# Patient Record
Sex: Female | Born: 1935 | Race: White | Hispanic: No | Marital: Married | Smoking: Former smoker
Health system: Southern US, Community
[De-identification: ages and names within clinical notes are randomized; demographics above are authoritative.]

## PROBLEM LIST (undated history)

## (undated) DIAGNOSIS — Z87828 Personal history of other (healed) physical injury and trauma: Secondary | ICD-10-CM

## (undated) DIAGNOSIS — I2510 Atherosclerotic heart disease of native coronary artery without angina pectoris: Secondary | ICD-10-CM

## (undated) DIAGNOSIS — I700 Atherosclerosis of aorta: Secondary | ICD-10-CM

## (undated) DIAGNOSIS — I517 Cardiomegaly: Secondary | ICD-10-CM

## (undated) DIAGNOSIS — G459 Transient cerebral ischemic attack, unspecified: Secondary | ICD-10-CM

## (undated) DIAGNOSIS — E785 Hyperlipidemia, unspecified: Secondary | ICD-10-CM

## (undated) DIAGNOSIS — E0500 Thyrotoxicosis with diffuse goiter without thyrotoxic crisis or storm: Secondary | ICD-10-CM

## (undated) DIAGNOSIS — I10 Essential (primary) hypertension: Secondary | ICD-10-CM

## (undated) DIAGNOSIS — Z7901 Long term (current) use of anticoagulants: Secondary | ICD-10-CM

## (undated) DIAGNOSIS — R001 Bradycardia, unspecified: Secondary | ICD-10-CM

## (undated) DIAGNOSIS — K449 Diaphragmatic hernia without obstruction or gangrene: Secondary | ICD-10-CM

## (undated) DIAGNOSIS — I350 Nonrheumatic aortic (valve) stenosis: Secondary | ICD-10-CM

## (undated) DIAGNOSIS — K5790 Diverticulosis of intestine, part unspecified, without perforation or abscess without bleeding: Secondary | ICD-10-CM

## (undated) HISTORY — PX: BRAIN SURGERY: SHX531

## (undated) HISTORY — DX: Long term (current) use of anticoagulants: Z79.01

## (undated) HISTORY — DX: Personal history of other (healed) physical injury and trauma: Z87.828

## (undated) HISTORY — DX: Atherosclerotic heart disease of native coronary artery without angina pectoris: I25.10

## (undated) HISTORY — DX: Thyrotoxicosis with diffuse goiter without thyrotoxic crisis or storm: E05.00

## (undated) HISTORY — DX: Diverticulosis of intestine, part unspecified, without perforation or abscess without bleeding: K57.90

## (undated) HISTORY — DX: Diaphragmatic hernia without obstruction or gangrene: K44.9

## (undated) HISTORY — DX: Bradycardia, unspecified: R00.1

## (undated) HISTORY — DX: Cardiomegaly: I51.7

## (undated) HISTORY — DX: Nonrheumatic aortic (valve) stenosis: I35.0

## (undated) HISTORY — DX: Hyperlipidemia, unspecified: E78.5

## (undated) HISTORY — DX: Atherosclerosis of aorta: I70.0

---

## 1998-07-18 ENCOUNTER — Other Ambulatory Visit: Admission: RE | Admit: 1998-07-18 | Discharge: 1998-07-18 | Payer: Self-pay | Admitting: *Deleted

## 2004-08-06 ENCOUNTER — Other Ambulatory Visit: Payer: Self-pay

## 2004-08-06 ENCOUNTER — Inpatient Hospital Stay: Payer: Self-pay | Admitting: Cardiology

## 2004-11-06 ENCOUNTER — Encounter: Payer: Self-pay | Admitting: Internal Medicine

## 2004-11-18 ENCOUNTER — Encounter: Payer: Self-pay | Admitting: Internal Medicine

## 2004-12-19 ENCOUNTER — Encounter: Payer: Self-pay | Admitting: Internal Medicine

## 2005-01-18 ENCOUNTER — Encounter: Payer: Self-pay | Admitting: Internal Medicine

## 2006-08-20 ENCOUNTER — Ambulatory Visit: Payer: Self-pay | Admitting: Unknown Physician Specialty

## 2007-10-25 ENCOUNTER — Emergency Department: Payer: Self-pay | Admitting: Emergency Medicine

## 2007-10-25 ENCOUNTER — Other Ambulatory Visit: Payer: Self-pay

## 2007-12-01 ENCOUNTER — Inpatient Hospital Stay: Payer: Self-pay | Admitting: Surgery

## 2009-08-02 ENCOUNTER — Ambulatory Visit: Payer: Self-pay

## 2009-12-28 ENCOUNTER — Ambulatory Visit: Payer: Self-pay | Admitting: Unknown Physician Specialty

## 2011-05-29 ENCOUNTER — Ambulatory Visit: Payer: Self-pay | Admitting: Internal Medicine

## 2012-04-20 ENCOUNTER — Ambulatory Visit: Payer: Self-pay | Admitting: Internal Medicine

## 2012-08-18 ENCOUNTER — Ambulatory Visit: Payer: Self-pay | Admitting: Internal Medicine

## 2012-10-08 ENCOUNTER — Ambulatory Visit: Payer: Self-pay | Admitting: Internal Medicine

## 2013-09-09 ENCOUNTER — Ambulatory Visit: Payer: Self-pay | Admitting: Internal Medicine

## 2013-09-27 ENCOUNTER — Ambulatory Visit: Payer: Self-pay | Admitting: Internal Medicine

## 2014-08-18 ENCOUNTER — Other Ambulatory Visit: Payer: Self-pay | Admitting: Internal Medicine

## 2014-08-18 DIAGNOSIS — R600 Localized edema: Secondary | ICD-10-CM

## 2014-08-23 ENCOUNTER — Ambulatory Visit
Admission: RE | Admit: 2014-08-23 | Discharge: 2014-08-23 | Disposition: A | Discharge status: Home / Self Care | Payer: Medicare Other | Source: Ambulatory Visit | Attending: Internal Medicine | Admitting: Internal Medicine

## 2014-08-23 DIAGNOSIS — R600 Localized edema: Secondary | ICD-10-CM

## 2015-01-24 ENCOUNTER — Encounter: Payer: Self-pay | Admitting: Emergency Medicine

## 2015-01-24 ENCOUNTER — Observation Stay
Admission: EM | Admit: 2015-01-24 | Discharge: 2015-01-26 | Disposition: A | Discharge status: Home / Self Care | Payer: Medicare Other | Attending: Specialist | Admitting: Specialist

## 2015-01-24 ENCOUNTER — Emergency Department: Payer: Medicare Other

## 2015-01-24 DIAGNOSIS — E213 Hyperparathyroidism, unspecified: Secondary | ICD-10-CM | POA: Diagnosis not present

## 2015-01-24 DIAGNOSIS — I639 Cerebral infarction, unspecified: Secondary | ICD-10-CM

## 2015-01-24 DIAGNOSIS — Z882 Allergy status to sulfonamides status: Secondary | ICD-10-CM | POA: Diagnosis not present

## 2015-01-24 DIAGNOSIS — H538 Other visual disturbances: Secondary | ICD-10-CM | POA: Insufficient documentation

## 2015-01-24 DIAGNOSIS — R4781 Slurred speech: Secondary | ICD-10-CM

## 2015-01-24 DIAGNOSIS — Z87891 Personal history of nicotine dependence: Secondary | ICD-10-CM | POA: Insufficient documentation

## 2015-01-24 DIAGNOSIS — R531 Weakness: Secondary | ICD-10-CM | POA: Diagnosis not present

## 2015-01-24 DIAGNOSIS — Z7982 Long term (current) use of aspirin: Secondary | ICD-10-CM | POA: Insufficient documentation

## 2015-01-24 DIAGNOSIS — R0902 Hypoxemia: Secondary | ICD-10-CM | POA: Insufficient documentation

## 2015-01-24 DIAGNOSIS — I517 Cardiomegaly: Secondary | ICD-10-CM | POA: Diagnosis not present

## 2015-01-24 DIAGNOSIS — R2689 Other abnormalities of gait and mobility: Secondary | ICD-10-CM | POA: Diagnosis present

## 2015-01-24 DIAGNOSIS — Z79899 Other long term (current) drug therapy: Secondary | ICD-10-CM | POA: Diagnosis not present

## 2015-01-24 DIAGNOSIS — I739 Peripheral vascular disease, unspecified: Secondary | ICD-10-CM | POA: Diagnosis not present

## 2015-01-24 DIAGNOSIS — I2510 Atherosclerotic heart disease of native coronary artery without angina pectoris: Secondary | ICD-10-CM | POA: Diagnosis not present

## 2015-01-24 DIAGNOSIS — R001 Bradycardia, unspecified: Secondary | ICD-10-CM | POA: Insufficient documentation

## 2015-01-24 DIAGNOSIS — I6523 Occlusion and stenosis of bilateral carotid arteries: Secondary | ICD-10-CM | POA: Insufficient documentation

## 2015-01-24 DIAGNOSIS — E0590 Thyrotoxicosis, unspecified without thyrotoxic crisis or storm: Secondary | ICD-10-CM | POA: Insufficient documentation

## 2015-01-24 DIAGNOSIS — Z8673 Personal history of transient ischemic attack (TIA), and cerebral infarction without residual deficits: Secondary | ICD-10-CM | POA: Diagnosis not present

## 2015-01-24 DIAGNOSIS — I10 Essential (primary) hypertension: Secondary | ICD-10-CM | POA: Diagnosis not present

## 2015-01-24 DIAGNOSIS — Z7902 Long term (current) use of antithrombotics/antiplatelets: Secondary | ICD-10-CM | POA: Diagnosis not present

## 2015-01-24 DIAGNOSIS — G459 Transient cerebral ischemic attack, unspecified: Principal | ICD-10-CM | POA: Insufficient documentation

## 2015-01-24 DIAGNOSIS — E785 Hyperlipidemia, unspecified: Secondary | ICD-10-CM | POA: Insufficient documentation

## 2015-01-24 HISTORY — DX: Transient cerebral ischemic attack, unspecified: G45.9

## 2015-01-24 HISTORY — DX: Essential (primary) hypertension: I10

## 2015-01-24 LAB — BASIC METABOLIC PANEL
ANION GAP: 5 (ref 5–15)
BUN: 25 mg/dL — AB (ref 6–20)
CALCIUM: 9.2 mg/dL (ref 8.9–10.3)
CO2: 26 mmol/L (ref 22–32)
Chloride: 111 mmol/L (ref 101–111)
Creatinine, Ser: 0.61 mg/dL (ref 0.44–1.00)
GFR calc Af Amer: >60 mL/min (ref >60)
GLUCOSE: 97 mg/dL (ref 65–99)
POTASSIUM: 4.1 mmol/L (ref 3.5–5.1)
SODIUM: 142 mmol/L (ref 135–145)

## 2015-01-24 LAB — URINALYSIS COMPLETE WITH MICROSCOPIC (ARMC ONLY)
BILIRUBIN URINE: NEGATIVE
GLUCOSE, UA: NEGATIVE mg/dL
Hgb urine dipstick: NEGATIVE
LEUKOCYTES UA: NEGATIVE
NITRITE: NEGATIVE
Protein, ur: NEGATIVE mg/dL
SPECIFIC GRAVITY, URINE: 1.027 (ref 1.005–1.030)
pH: 5 (ref 5.0–8.0)

## 2015-01-24 LAB — CBC
HCT: 35.3 % (ref 35.0–47.0)
Hemoglobin: 11.7 g/dL — ABNORMAL LOW (ref 12.0–16.0)
MCH: 31.2 pg (ref 26.0–34.0)
MCHC: 33 g/dL (ref 32.0–36.0)
MCV: 94.7 fL (ref 80.0–100.0)
Platelets: 239 10*3/uL (ref 150–440)
RBC: 3.73 MIL/uL — ABNORMAL LOW (ref 3.80–5.20)
RDW: 13.4 % (ref 11.5–14.5)
WBC: 9.9 10*3/uL (ref 3.6–11.0)

## 2015-01-24 MED ORDER — NITROGLYCERIN 0.4 MG SL SUBL: 0.4000 mg | SUBLINGUAL_TABLET | SUBLINGUAL | Status: DC | PRN

## 2015-01-24 MED ORDER — ONDANSETRON HCL 4 MG/2ML IJ SOLN: 4.0000 mg | Freq: Four times a day (QID) | INTRAMUSCULAR | Status: DC | PRN

## 2015-01-24 MED ORDER — ONDANSETRON HCL 4 MG PO TABS: 4.0000 mg | ORAL_TABLET | Freq: Four times a day (QID) | ORAL | Status: DC | PRN

## 2015-01-24 MED ORDER — ASPIRIN EC 81 MG PO TBEC
81.0000 mg | DELAYED_RELEASE_TABLET | Freq: Every day | ORAL | Status: DC
  Administered 2015-01-24 – 2015-01-25 (×2): 81 mg via ORAL
  Filled 2015-01-24 (×2): qty 1

## 2015-01-24 MED ORDER — ADULT MULTIVITAMIN W/MINERALS CH
1.0000 | ORAL_TABLET | Freq: Every day | ORAL | Status: DC
  Administered 2015-01-25: 16:00:00 1 via ORAL
  Filled 2015-01-24: qty 1

## 2015-01-24 MED ORDER — IRBESARTAN 150 MG PO TABS
300.0000 mg | ORAL_TABLET | Freq: Every day | ORAL | Status: DC
  Administered 2015-01-25 – 2015-01-26 (×2): 300 mg via ORAL
  Filled 2015-01-24: qty 4
  Filled 2015-01-24: qty 2
  Filled 2015-01-24: qty 4

## 2015-01-24 MED ORDER — SODIUM CHLORIDE 0.9 % IJ SOLN
3.0000 mL | Freq: Two times a day (BID) | INTRAMUSCULAR | Status: DC
  Administered 2015-01-24: 23:00:00 3 mL via INTRAVENOUS

## 2015-01-24 MED ORDER — CALCIUM CARBONATE-VITAMIN D 500-200 MG-UNIT PO TABS
1.0000 | ORAL_TABLET | Freq: Every day | ORAL | Status: DC
  Administered 2015-01-25 – 2015-01-26 (×2): 1 via ORAL
  Filled 2015-01-24 (×2): qty 1

## 2015-01-24 MED ORDER — ACETAMINOPHEN 650 MG RE SUPP: 650.0000 mg | Freq: Four times a day (QID) | RECTAL | Status: DC | PRN

## 2015-01-24 MED ORDER — CITALOPRAM HYDROBROMIDE 20 MG PO TABS
20.0000 mg | ORAL_TABLET | Freq: Every day | ORAL | Status: DC
  Administered 2015-01-25 – 2015-01-26 (×2): 20 mg via ORAL
  Filled 2015-01-24 (×2): qty 1

## 2015-01-24 MED ORDER — METHIMAZOLE 5 MG PO TABS
2.5000 mg | ORAL_TABLET | Freq: Every day | ORAL | Status: DC
  Administered 2015-01-25 – 2015-01-26 (×2): 2.5 mg via ORAL
  Filled 2015-01-24 (×2): qty 1

## 2015-01-24 MED ORDER — ACETAMINOPHEN 325 MG PO TABS: 650.0000 mg | ORAL_TABLET | Freq: Four times a day (QID) | ORAL | Status: DC | PRN

## 2015-01-24 MED ORDER — SODIUM CHLORIDE 0.9 % IV SOLN
INTRAVENOUS | Status: DC
Start: 2015-01-24 — End: 2015-01-25
  Administered 2015-01-24 – 2015-01-25 (×2): via INTRAVENOUS

## 2015-01-24 MED ORDER — MORPHINE SULFATE (PF) 2 MG/ML IV SOLN
2.0000 mg | INTRAVENOUS | Status: DC | PRN
Start: 2015-01-24 — End: 2015-01-26

## 2015-01-24 MED ORDER — STROKE: EARLY STAGES OF RECOVERY BOOK
Freq: Once | Status: AC
  Administered 2015-01-24: 22:00:00

## 2015-01-24 MED ORDER — SIMVASTATIN 40 MG PO TABS
40.0000 mg | ORAL_TABLET | Freq: Every day | ORAL | Status: DC
Start: 2015-01-24 — End: 2015-01-25
  Administered 2015-01-24: 23:00:00 40 mg via ORAL
  Filled 2015-01-24: qty 1

## 2015-01-24 MED ORDER — OXYCODONE HCL 5 MG PO TABS: 5.0000 mg | ORAL_TABLET | ORAL | Status: DC | PRN

## 2015-01-24 NOTE — ED Notes (Signed)
Pt ambulatory to stat desk with c/o  Trouble talking and walking started last night. Pt with hx of TIA>

## 2015-01-24 NOTE — ED Provider Notes (Signed)
[facility] Emergency Department Provider Note  ____________________________________________  Time seen: Approximately 7:11 PM  I have reviewed the triage vital signs and the nursing notes.   HISTORY  Chief Complaint Weakness  Haley Rollins is obtained from the patient and her daughter.  HPI Haley Rollins is a 81 y.o. female with a history of HTN, TIA, remote intracranial hemorrhage requiring neurosurgical decompression presenting with changes in speech. Patient and her daughter report that since yesterday morning the patient has been having some slurred speech and occasional word finding difficulty. In addition she has been composing "sentences that don't make sense." The only other symptom the patient has noticed is some imbalance when she walks; she has not had a fall. She denies any numbness, tingling, weakness, double vision, chest pain or palpitations, lightheadedness or syncope, fever or chills, or changes in medications. No trauma. She has had intermittent blurred vision where the TV is not as sharp as before.   Past Medical History  Diagnosis Date  . TIA (transient ischemic attack)   . Hypertension     Patient Active Problem List   Diagnosis Date Noted  . TIA (transient ischemic attack) 01/24/2015    Past Surgical History  Procedure Laterality Date  . Brain surgery      No current outpatient prescriptions on file.  Allergies Sulfa antibiotics  Family History  Problem Relation Age of Onset  . Diabetes Neg Hx     Social History Social History  Substance Use Topics  . Smoking status: Former Smoker    Types: Cigarettes  . Smokeless tobacco: None  . Alcohol Use: 0.6 - 1.2 oz/week    1-2 Glasses of wine per week    Review of Systems Constitutional: No fever/chills. No lightheadedness or syncope. Eyes: Occasional blurred vision. ENT: No sore throat. Cardiovascular: Denies chest pain, palpitations. Respiratory: Denies shortness of  breath.  No cough. Gastrointestinal: No abdominal pain.  No nausea, no vomiting.  No diarrhea.  No constipation. Genitourinary: Negative for dysuria. Musculoskeletal: Negative for back pain. Skin: Negative for rash. Neurological: Negative for headache, changes in mental status. Positive for slurred speech, expressive aphasia. Positive for intermittent blurred vision. Positive for gait imbalance.  10-point ROS otherwise negative.  ____________________________________________   PHYSICAL EXAM:  VITAL SIGNS: ED Triage Vitals  Enc Vitals Group     BP 01/24/15 1836 191/70 mmHg     Pulse Rate 01/24/15 1836 55     Resp 01/24/15 1836 18     Temp 01/24/15 1836 97.5 F (36.4 C)     Temp Source 01/24/15 1836 Oral     SpO2 01/24/15 1836 99 %     Weight 01/24/15 1836 190 lb (86.183 kg)     Height 01/24/15 1836 5\' 5"  (1.651 m)     Head Cir --      Peak Flow --      Pain Score --      Pain Loc --      Pain Edu? --      Excl. in [place]? --     Constitutional: Alert and oriented. Well appearing and in no acute distress. Answer question appropriately. Eyes: Conjunctivae are normal.  EOMI. PERRLA. Head: Atraumatic. Nose: No congestion/rhinnorhea. Mouth/Throat: Mucous membranes are moist.  Neck: No stridor.  Supple.  No JVD. Cardiovascular: Normal rate, regular rhythm. No murmurs, rubs or gallops.  Respiratory: Normal respiratory effort.  No retractions. Lungs CTAB.  No wheezes, rales or ronchi. Gastrointestinal: Soft and nontender. No distention. No peritoneal  signs. Musculoskeletal: No LE edema.  Neurologic: Alert and oriented 3. Speech is clear. Naming and repetition are intact. At times, the patient appears to have word finding difficulty. Face and smile symmetric. Tongue is midline. Normal visual fields. No pronator drift. 5 out of 5 grip, biceps, triceps, hip flexors, plantar flexion and dorsiflexion. Normal sensation to light touch in the bilateral upper and lower extremities, and face.  Normal heel-to-shin. Skin:  Skin is warm, dry and intact. No rash noted. Psychiatric: Mood and affect are normal. Speech and behavior are normal.  Normal judgement.  ____________________________________________   LABS (all labs ordered are listed, but only abnormal results are displayed)  Labs Reviewed  BASIC METABOLIC PANEL - Abnormal; Notable for the following:    BUN 25 (*)    All other components within normal limits  CBC - Abnormal; Notable for the following:    RBC 3.73 (*)    Hemoglobin 11.7 (*)    All other components within normal limits  URINALYSIS COMPLETEWITH MICROSCOPIC ([facility] ONLY) - Abnormal; Notable for the following:    Color, Urine YELLOW (*)    APPearance HAZY (*)    Ketones, ur 1+ (*)    Bacteria, UA RARE (*)    Squamous Epithelial / LPF 6-30 (*)    All other components within normal limits  HEMOGLOBIN A1C  LIPID PANEL  THYROID PANEL WITH TSH  CBG MONITORING, ED   ____________________________________________  EKG  ED ECG REPORT I, Eula Listen, the attending physician, personally viewed and interpreted this ECG.   Date: 01/24/2015  EKG Time: 1838  Rate: 53  Rhythm: sinus bradycardia  Axis: Leftward  Intervals:none  ST&T Change: Nonspecific T-wave inversions in V1. No ST elevation.  ____________________________________________  RADIOLOGY  Ct Head Wo Contrast  01/24/2015  CLINICAL DATA:  Trouble speaking and walking.  History of TIA. EXAM: CT HEAD WITHOUT CONTRAST TECHNIQUE: Contiguous axial images were obtained from the base of the skull through the vertex without intravenous contrast. COMPARISON:  04/20/2012 FINDINGS: Skull and Sinuses:Unremarkable appearance of left anterior craniotomy site for unknown indication. No acute fracture. No destructive process. No visualized sinusitis or mastoiditis. Orbits: Bilateral cataract resection. Brain: No evidence of acute infarction, hemorrhage, hydrocephalus, or mass lesion/mass effect. There is a  chronic lacunar infarct in the left thalamus. Chronic small vessel infarct in the upper left cerebellum. Mild generalized cerebral volume loss, typical for age. Chronic ischemic gliosis present around the lateral ventricles, stable. IMPRESSION: No acute finding or change from 2014. Electronically Signed   By: Monte Fantasia M.D.   On: 01/24/2015 19:23    ____________________________________________   PROCEDURES  Procedure(s) performed: None  Critical Care performed: No ____________________________________________   INITIAL IMPRESSION / ASSESSMENT AND PLAN / ED COURSE  Pertinent labs & imaging results that were available during my care of the patient were reviewed by me and considered in my medical decision making (see chart for details).  81 y.o. female with a history of HTN and TIA presenting with slurred speech and expressive aphasia, blurred vision and imbalanced walking. On my exam, her speech is normal except for occasional hesitation and possible word finding. The patient has no evidence of ataxia on my cerebellar exam. Her CT scan is negative for any acute process, however, am concerned for CVA and will have her admitted to the hospital. I am awaiting the results of her laboratory studies, urinalysis.  ____________________________________________  FINAL CLINICAL IMPRESSION(S) / ED DIAGNOSES  Final diagnoses:  Slurred speech  Cerebral infarction due  to unspecified mechanism  Imbalance  Blurred vision, bilateral      NEW MEDICATIONS STARTED DURING THIS VISIT:  Current Discharge Medication List       Eula Listen, MD 01/24/15 2351

## 2015-01-24 NOTE — H&P (Signed)
[place] [facility] [place] NAME: Haley Rollins    MR#:  [id number]  DATE OF BIRTH:  Sep 27, 1931   DATE OF ADMISSION:  01/24/2015  PRIMARY CARE PHYSICIAN: Adin Hector, MD   REQUESTING/REFERRING PHYSICIAN: Mariea Clonts  CHIEF COMPLAINT:   Chief Complaint  Patient presents with  . Weakness   speech difficulty  HISTORY OF PRESENT ILLNESS:  Haley Rollins  is a 81 y.o. female with a known history of essential hypertension as well as prior TIA presenting with difficulty ambulating as well as speech difficulty. She describes 1 day duration of difficulty walking secondary to balance issues as well as difficulty with her speech which she describes as difficulty getting words out. Her symptoms have somewhat improved since onset but still remain. She denies further symptomatology at this time in the emergency department she was noted to be hypertensive  PAST MEDICAL HISTORY:   Past Medical History  Diagnosis Date  . TIA (transient ischemic attack)   . Hypertension     PAST SURGICAL HISTORY:   Past Surgical History  Procedure Laterality Date  . Brain surgery      SOCIAL HISTORY:   Social History  Substance Use Topics  . Smoking status: Never Smoker   . Smokeless tobacco: Not on file  . Alcohol Use: No    FAMILY HISTORY:   Family History  Problem Relation Age of Onset  . Diabetes Neg Hx     DRUG ALLERGIES:   Allergies  Allergen Reactions  . Sulfa Antibiotics Hives    REVIEW OF SYSTEMS:  REVIEW OF SYSTEMS:  CONSTITUTIONAL: Denies fevers, chills, fatigue, weakness.  EYES: Denies blurred vision, double vision, or eye pain.  EARS, NOSE, THROAT: Denies tinnitus, ear pain, hearing loss.  RESPIRATORY: denies cough, shortness of breath, wheezing  CARDIOVASCULAR: Denies chest pain, palpitations, edema.  GASTROINTESTINAL: Denies nausea, vomiting, diarrhea, abdominal pain.  GENITOURINARY: Denies dysuria, hematuria.   ENDOCRINE: Denies nocturia or thyroid problems. HEMATOLOGIC AND LYMPHATIC: Denies easy bruising or bleeding.  SKIN: Denies rash or lesions.  MUSCULOSKELETAL: Denies pain in neck, back, shoulder, knees, hips, or further arthritic symptoms.  NEUROLOGIC: Denies paralysis, paresthesias.  PSYCHIATRIC: Denies anxiety or depressive symptoms. Otherwise full review of systems performed by me is negative.   MEDICATIONS AT HOME:   Prior to Admission medications   Medication Sig Start Date End Date Taking? Authorizing Provider  aspirin EC 81 MG tablet Take 81 mg by mouth at bedtime.    Yes Historical Provider, MD  Calcium Carbonate-Vitamin D (CALCIUM 600+D) 600-400 MG-UNIT tablet Take 1 tablet by mouth daily.    Yes Historical Provider, MD  citalopram (CELEXA) 20 MG tablet Take 20 mg by mouth daily.   Yes Historical Provider, MD  irbesartan (AVAPRO) 300 MG tablet Take 300 mg by mouth daily.   Yes Historical Provider, MD  methimazole (TAPAZOLE) 5 MG tablet Take 2.5 mg by mouth daily.   Yes Historical Provider, MD  metoprolol succinate (TOPROL-XL) 25 MG 24 hr tablet Take 12.5 mg by mouth at bedtime.    Yes Historical Provider, MD  Multiple Vitamin (MULTIVITAMIN WITH MINERALS) TABS tablet Take 1 tablet by mouth daily.   Yes Historical Provider, MD  nitroGLYCERIN (NITROSTAT) 0.4 MG SL tablet Place 0.4 mg under the tongue every 5 (five) minutes as needed for chest pain.   Yes Historical Provider, MD  simvastatin (ZOCOR) 40 MG tablet Take 40 mg by mouth at bedtime.   Yes Historical Provider, MD  VITAL SIGNS:  Blood pressure 161/67, pulse 50, temperature 97.5 F (36.4 C), temperature source Oral, resp. rate 18, height 5\' 5"  (1.651 m), weight 190 lb (86.183 kg), SpO2 100 %.  PHYSICAL EXAMINATION:  VITAL SIGNS: Filed Vitals:   01/24/15 1953 01/24/15 2037  BP: 163/68 161/67  Pulse: 49 50  Temp:    Resp: 22 18   GENERAL:81 y.o.female currently in no acute distress.  HEAD: Normocephalic,  atraumatic.  EYES: Pupils equal, round, reactive to light. Extraocular muscles intact. No scleral icterus.  MOUTH: Moist mucosal membrane. Dentition intact. No abscess noted.  EAR, NOSE, THROAT: Clear without exudates. No external lesions.  NECK: Supple. No thyromegaly. No nodules. No JVD.  PULMONARY: Clear to ascultation, without wheeze rails or rhonci. No use of accessory muscles, Good respiratory effort. good air entry bilaterally CHEST: Nontender to palpation.  CARDIOVASCULAR: S1 and S2. Regular rate and rhythm. No murmurs, rubs, or gallops. No edema. Pedal pulses 2+ bilaterally.  GASTROINTESTINAL: Soft, nontender, nondistended. No masses. Positive bowel sounds. No hepatosplenomegaly.  MUSCULOSKELETAL: No swelling, clubbing, or edema. Range of motion full in all extremities.  NEUROLOGIC: Cranial nerves II through XII are intact. Sensation intact. Reflexes intact. Pronator drift within normal limits strength 5/5 all extremities including proximal/distal flexion/extension finger to nose left hand with evidence of past pointing dysdiadochokinesis somewhat delayed left hand as well. Mildly Aphasic speech pattern SKIN: No ulceration, lesions, rashes, or cyanosis. Skin warm and dry. Turgor intact.  PSYCHIATRIC: Mood, affect within normal limits. The patient is awake, alert and oriented x 3. Insight, judgment intact.    LABORATORY PANEL:   CBC  Recent Labs Lab 01/24/15 1840  WBC 9.9  HGB 11.7*  HCT 35.3  PLT 239   ------------------------------------------------------------------------------------------------------------------  Chemistries   Recent Labs Lab 01/24/15 1840  NA 142  K 4.1  CL 111  CO2 26  GLUCOSE 97  BUN 25*  CREATININE 0.61  CALCIUM 9.2   ------------------------------------------------------------------------------------------------------------------  Cardiac Enzymes No results for input(s): TROPONINI in the last 168  hours. ------------------------------------------------------------------------------------------------------------------  RADIOLOGY:  Ct Head Wo Contrast  01/24/2015  CLINICAL DATA:  Trouble speaking and walking.  History of TIA. EXAM: CT HEAD WITHOUT CONTRAST TECHNIQUE: Contiguous axial images were obtained from the base of the skull through the vertex without intravenous contrast. COMPARISON:  04/20/2012 FINDINGS: Skull and Sinuses:Unremarkable appearance of left anterior craniotomy site for unknown indication. No acute fracture. No destructive process. No visualized sinusitis or mastoiditis. Orbits: Bilateral cataract resection. Brain: No evidence of acute infarction, hemorrhage, hydrocephalus, or mass lesion/mass effect. There is a chronic lacunar infarct in the left thalamus. Chronic small vessel infarct in the upper left cerebellum. Mild generalized cerebral volume loss, typical for age. Chronic ischemic gliosis present around the lateral ventricles, stable. IMPRESSION: No acute finding or change from 2014. Electronically Signed   By: Monte Fantasia M.D.   On: 01/24/2015 19:23    EKG:   Orders placed or performed during the hospital encounter of 01/24/15  . ED EKG  . ED EKG  . EKG 12-Lead  . EKG 12-Lead    IMPRESSION AND PLAN:   81 year old Caucasian female history of essential hypertension as well as prior TIA presenting with strokelike symptoms  1. TIA: Symptoms improving but still present, initiate aspirin/statin therapy place on telemetry and check MRI lipid panel hemoglobin A1c for risk factor modification will also check carotid Doppler and echocardiogram 2. Essential hypertension: Permissive hypertension treating only greater than 220/120 or symptomatic at that time IV hydralazine 3.  Hyperthyroidism continue with methimazole will check TSH free T4 4. Hyperlipidemia unspecified: Statin therapy 5. Venous thromboembolism prophylactic: SCDs  All the records are reviewed and case  discussed with ED provider. Management plans discussed with the patient, family and they are in agreement.  CODE STATUS: Full  TOTAL TIME TAKING CARE OF THIS PATIENT: 35 minutes.    Margurete Guaman,  Karenann Cai.D on 01/24/2015 at 9:08 PM  Between 7am to 6pm - Pager - [phone]  After 6pm: House Pager: - [facility]  Office  [phone]  CC: Primary care physician; Adin Hector, MD

## 2015-01-24 NOTE — ED Notes (Signed)
Ambulated to BR with steady gait.  NAD

## 2015-01-25 ENCOUNTER — Observation Stay: Payer: Medicare Other

## 2015-01-25 ENCOUNTER — Observation Stay
Admit: 2015-01-25 | Discharge: 2015-01-25 | Disposition: A | Discharge status: Home / Self Care | Payer: Medicare Other | Attending: Internal Medicine | Admitting: Internal Medicine

## 2015-01-25 DIAGNOSIS — G459 Transient cerebral ischemic attack, unspecified: Secondary | ICD-10-CM | POA: Diagnosis not present

## 2015-01-25 LAB — LIPID PANEL
CHOL/HDL RATIO: 2.6 ratio
CHOLESTEROL: 135 mg/dL (ref 0–200)
HDL: 52 mg/dL (ref >40)
LDL Cholesterol: 70 mg/dL (ref 0–99)
TRIGLYCERIDES: 63 mg/dL (ref <150)
VLDL: 13 mg/dL (ref 0–40)

## 2015-01-25 LAB — HEMOGLOBIN A1C: HEMOGLOBIN A1C: 5.9 % (ref 4.0–6.0)

## 2015-01-25 LAB — T4, FREE: Free T4: 0.98 ng/dL (ref 0.61–1.12)

## 2015-01-25 LAB — TSH: TSH: 3.515 u[IU]/mL (ref 0.350–4.500)

## 2015-01-25 MED ORDER — METOPROLOL SUCCINATE ER 25 MG PO TB24: 12.5000 mg | ORAL_TABLET | Freq: Every day | ORAL | Status: DC

## 2015-01-25 MED ORDER — ATORVASTATIN CALCIUM 20 MG PO TABS
40.0000 mg | ORAL_TABLET | Freq: Every day | ORAL | Status: DC
  Administered 2015-01-25: 16:00:00 40 mg via ORAL
  Filled 2015-01-25: qty 2

## 2015-01-25 MED ORDER — CLOPIDOGREL BISULFATE 75 MG PO TABS
75.0000 mg | ORAL_TABLET | Freq: Every day | ORAL | Status: DC
  Administered 2015-01-25 – 2015-01-26 (×2): 75 mg via ORAL
  Filled 2015-01-25 (×2): qty 1

## 2015-01-25 NOTE — Progress Notes (Signed)
*  PRELIMINARY RESULTS* Echocardiogram 2D Echocardiogram has been performed.  Haley Rollins 01/25/2015, 8:47 AM

## 2015-01-25 NOTE — Evaluation (Signed)
Occupational Therapy Evaluation Patient Details Name: TZIPPORAH JURICK MRN: [id number] DOB: 04-01-1931 Today's Date: 01/25/2015    History of Present Illness This patient is an 81 year old female who came to [facility] with speech deficits which have mostly resolved.   Clinical Impression   This patient is an 81 year old female who came to [facility] with the above history. She demonstrates ability to dress herself with no assistive device and get to the bathroom. No further Occupational Therapy needed.    Follow Up Recommendations  No OT follow up (home)    Equipment Recommendations       Recommendations for Other Services       Precautions / Restrictions Precautions Precautions: Fall Restrictions Weight Bearing Restrictions: No      Mobility Bed Mobility Overal bed mobility: Modified Independent                Transfers Overall transfer level:  (supervision)                    Balance                                            ADL                                         General ADL Comments: Had been independent and reports placing mulch in garden. Patient demonstrates the ability to dress self and get to the bathroom.     Vision     Perception     Praxis      Pertinent Vitals/Pain       Hand Dominance     Extremity/Trunk Assessment Upper Extremity Assessment Upper Extremity Assessment: Overall WFL for tasks assessed (No sensory deficits in upper extremities)   Lower Extremity Assessment Lower Extremity Assessment: Defer to PT evaluation       Communication     Cognition Arousal/Alertness: Awake/alert Behavior During Therapy: WFL for tasks assessed/performed Overall Cognitive Status: Within Functional Limits for tasks assessed                     General Comments       Exercises       Shoulder Instructions      Home Living Family/patient expects to be  discharged to:: Private residence Living Arrangements: Spouse/significant other Available Help at Discharge: Family Type of Home: [place]: One level                          Prior Functioning/Environment               OT Diagnosis:     OT Problem List:     OT Treatment/Interventions:      OT Goals(Current goals can be found in the care plan section)    OT Frequency:     Barriers to D/C:            Co-evaluation              End of Session Equipment Utilized During Treatment: Gait belt  Activity Tolerance:   Patient left: in chair;with call bell/phone within reach;with chair alarm set;Other (comment) (With  physical therapy present)   Time: [id number] OT Time Calculation (min): 18 min Charges:  OT General Charges $OT Visit: 1 Procedure OT Evaluation $Initial OT Evaluation Tier I: 1 Procedure G-Codes:    Myrene Galas, MS/OTR/L  01/25/2015, 11:53 AM

## 2015-01-25 NOTE — Evaluation (Signed)
Physical Therapy Evaluation Patient Details Name: Haley Rollins MRN: [id number] DOB: 26-Oct-1931 Today's Date: 01/25/2015   History of Present Illness  Patient is an 81yo white female who began experiencing some expressive aphasia while at home on Monday. Pt denies any numbness, tingling, or weakness assoicated with this event. Pt reports that for two months, she has been experiencing intermittent sharp and sudden piercing pain HA that resolve within 30 seconds. At eval, pt reports most of her word finding difficulty hs resolved, but remains frustrating with longer sentenses.   Clinical Impression  Pt reports that 90% of symptoms have resolved and that 100% of the generalized weakness symptoms have resolved. Pt is performing all mobility independently and safely, no LOB with balance screening, ambulating 500+ft without AD. Strength and sensation are all at baseline, intact, and symmetrical. Pt does not demonstrate any impairment at this time. No further PT services needed at this time.     Follow Up Recommendations No PT follow up    Equipment Recommendations  None recommended by PT    Recommendations for Other Services Speech consult     Precautions / Restrictions Precautions Precautions: Fall Restrictions Weight Bearing Restrictions: No      Mobility  Bed Mobility Overal bed mobility: Modified Independent             General bed mobility comments: received in chair   Transfers Overall transfer level: Modified independent Equipment used: None Transfers: Sit to/from Stand Sit to Stand: Modified independent (Device/Increase time)         General transfer comment: Safe, stable, and strong  Ambulation/Gait Ambulation/Gait assistance: Supervision Ambulation Distance (Feet): 500 Feet Assistive device: None Gait Pattern/deviations: WFL(Within Functional Limits) Gait velocity: Max gait speed: 1.69m/s  Gait velocity interpretation: at or above normal speed for  age/gender General Gait Details: No LOB, HR 100.   Stairs Stairs: Yes Stairs assistance: Supervision Stair Management: One rail Right;One rail Left Number of Stairs: 4 General stair comments: appears safe and stable.   Wheelchair Mobility    Modified Rankin (Stroke Patients Only)       Balance Overall balance assessment: No apparent balance deficits (not formally assessed) (history of falls/head trauma in 2002. )                                           Pertinent Vitals/Pain Pain Assessment: No/denies pain    Home Living Family/patient expects to be discharged to:: Private residence Living Arrangements: Spouse/significant other Available Help at Discharge: Family Type of Home: House Home Access: Stairs to enter Entrance Stairs-Rails: Can reach both Entrance Stairs-Number of Steps: 4 Home Layout: One level        Prior Function Level of Independence: Independent               Hand Dominance   Dominant Hand: Right    Extremity/Trunk Assessment   Upper Extremity Assessment: Defer to OT evaluation           Lower Extremity Assessment: Overall WFL for tasks assessed      Cervical / Trunk Assessment: Normal  Communication   Communication: No difficulties  Cognition Arousal/Alertness: Awake/alert Behavior During Therapy: WFL for tasks assessed/performed Overall Cognitive Status: Within Functional Limits for tasks assessed                      General Comments General comments (skin  integrity, edema, etc.): No LOB with sharpened rhomberg, forward reacah of 12", moderate trunk perturbation.     Exercises        Assessment/Plan    PT Assessment Patent does not need any further PT services  PT Diagnosis     PT Problem List    PT Treatment Interventions     PT Goals (Current goals can be found in the Care Plan section) Acute Rehab PT Goals PT Goal Formulation: All assessment and education complete, DC therapy     Frequency     Barriers to discharge        Co-evaluation               End of Session Equipment Utilized During Treatment: Gait belt Activity Tolerance: Patient tolerated treatment well Patient left: in chair;with call bell/phone within reach;with chair alarm set;with family/visitor present Nurse Communication: Mobility status    Functional Assessment Tool Used: Clinical Judgment  Functional Limitation: Mobility: Walking and moving around Mobility: Walking and Moving Around Current Status [id number]): At least 1 percent but less than 20 percent impaired, limited or restricted Mobility: Walking and Moving Around Goal Status [phone]): At least 1 percent but less than 20 percent impaired, limited or restricted Mobility: Walking and Moving Around Discharge Status [phone]): At least 1 percent but less than 20 percent impaired, limited or restricted    Time: 1103-1129 PT Time Calculation (min) (ACUTE ONLY): 26 min   Charges:   PT Evaluation $Initial PT Evaluation Tier I: 1 Procedure PT Treatments $Therapeutic Activity: 8-22 mins   PT G Codes:   PT G-Codes **NOT FOR INPATIENT CLASS** Functional Assessment Tool Used: Clinical Judgment  Functional Limitation: Mobility: Walking and moving around Mobility: Walking and Moving Around Current Status [id number]): At least 1 percent but less than 20 percent impaired, limited or restricted Mobility: Walking and Moving Around Goal Status [phone]): At least 1 percent but less than 20 percent impaired, limited or restricted Mobility: Walking and Moving Around Discharge Status [phone]): At least 1 percent but less than 20 percent impaired, limited or restricted    Buccola,Allan C 01/25/2015, 12:18 PM  12:25 PM  Haley Rollins, PT, DPT [place] License # [id number]

## 2015-01-25 NOTE — Evaluation (Signed)
Occupational Therapy Evaluation Patient Details Name: Haley Rollins MRN: [id number] DOB: Aug 13, 1931 Today's Date: 01/25/2015    History of Present Illness Patient is an 81yo white female who began experiencing some expressive aphasia while at home on Monday. Pt denies any numbness, tingling, or weakness assoicated with this event. Pt reports that for two months, she has been experiencing intermittent sharp and sudden piercing pain HA that resolve within 30 seconds. At eval, pt reports most of her word finding difficulty hs resolved, but remains frustrating with longer sentenses.    Clinical Impression   Patient demonstrates normal age appropriate strength, no sensory deficits and demonstrates ability to dress and transfer to the bathroom. No further Occupational Therapy needed at this time.    Follow Up Recommendations  No OT follow up (home)    Equipment Recommendations       Recommendations for Other Services       Precautions / Restrictions Precautions Precautions: Fall Restrictions Weight Bearing Restrictions: No      Mobility Bed Mobility Overal bed mobility: Modified Independent             General bed mobility comments: received in chair   Transfers Overall transfer level: Modified independent Equipment used: None Transfers: Sit to/from Stand Sit to Stand: Modified independent (Device/Increase time)         General transfer comment: Safe, stable, and strong    Balance Overall balance assessment: No apparent balance deficits (not formally assessed) (history of falls/head trauma in 2002. )                                          ADL                                         General ADL Comments: Had been independent and reports placing mulch in garden     Vision     Perception     Praxis      Pertinent Vitals/Pain Pain Assessment: No/denies pain     Hand Dominance Right   Extremity/Trunk Assessment  Upper Extremity Assessment Upper Extremity Assessment: Defer to OT evaluation   Lower Extremity Assessment Lower Extremity Assessment: Overall WFL for tasks assessed   Cervical / Trunk Assessment Cervical / Trunk Assessment: Normal   Communication Communication Communication: No difficulties   Cognition Arousal/Alertness: Awake/alert Behavior During Therapy: WFL for tasks assessed/performed Overall Cognitive Status: Within Functional Limits for tasks assessed                     General Comments       Exercises       Shoulder Instructions      Home Living Family/patient expects to be discharged to:: Private residence Living Arrangements: Spouse/significant other Available Help at Discharge: Family Type of Home: House Home Access: Stairs to enter Technical brewer of Steps: 4 Entrance Stairs-Rails: Can reach both Home Layout: One level                          Prior Functioning/Environment Level of Independence: Independent             OT Diagnosis:     OT Problem List:     OT Treatment/Interventions:  OT Goals(Current goals can be found in the care plan section)    OT Frequency:     Barriers to D/C:            Co-evaluation              End of Session Equipment Utilized During Treatment: Gait belt  Activity Tolerance:   Patient left: in chair;with call bell/phone within reach;with chair alarm set;Other (comment) (With physical therapy present)   Time: [id number] OT Time Calculation (min): 18 min Charges:  OT General Charges $OT Visit: 1 Procedure OT Evaluation $Initial OT Evaluation Tier I: 1 Procedure G-Codes: OT G-codes **NOT FOR INPATIENT CLASS** Functional Assessment Tool Used: clinical judgment Functional Limitation: Self care Self Care Current Status [id number]): At least 1 percent but less than 20 percent impaired, limited or restricted Self Care Goal Status [id number]): At least 1 percent but less than 20  percent impaired, limited or restricted Self Care Discharge Status [phone]): At least 1 percent but less than 20 percent impaired, limited or restricted  Sharon Mt 01/25/2015, 2:33 PM

## 2015-01-25 NOTE — Progress Notes (Signed)
[facility] [place]    MR#:  [id number]  DATE OF BIRTH:  [birth year]  SUBJECTIVE:  CHIEF COMPLAINT:   Chief Complaint  Patient presents with  . Weakness   Has very mild a aphasia otherwise asymptomatic  REVIEW OF SYSTEMS:   Review of Systems  Constitutional: Negative for fever.  Respiratory: Negative for shortness of breath.   Cardiovascular: Negative for chest pain and palpitations.  Gastrointestinal: Negative for nausea, vomiting and abdominal pain.  Genitourinary: Negative for dysuria.  Neurological: Positive for sensory change and speech change.    DRUG ALLERGIES:   Allergies  Allergen Reactions  . Sulfa Antibiotics Hives    VITALS:  Blood pressure 159/51, pulse 58, temperature 98.4 F (36.9 C), temperature source Oral, resp. rate 18, height 5\' 5"  (1.651 m), weight 87.635 kg (193 lb 3.2 oz), SpO2 96 %.  PHYSICAL EXAMINATION:  GENERAL:  81 y.o.-year-old patient lying in the bed with no acute distress.  EYES: Pupils equal, round, reactive to light and accommodation. No scleral icterus. Extraocular muscles intact.  HEENT: Head atraumatic, normocephalic. Oropharynx and nasopharynx clear.  NECK:  Supple, no jugular venous distention. No thyroid enlargement, no tenderness.  LUNGS: Normal breath sounds bilaterally, no wheezing, rales,rhonchi or crepitation. No use of accessory muscles of respiration.  CARDIOVASCULAR: S1, S2 normal. No murmurs, rubs, or gallops.  ABDOMEN: Soft, nontender, nondistended. Bowel sounds present. No organomegaly or mass.  EXTREMITIES: No pedal edema, cyanosis, or clubbing.  NEUROLOGIC: Cranial nerves II through XII are intact. Muscle strength 5/5 in all extremities. Sensation intact. Gait not checked. Speech increasingly fluent PSYCHIATRIC: The patient is alert and oriented x 3.  SKIN: No obvious rash, lesion, or ulcer.    LABORATORY PANEL:   CBC  Recent Labs Lab  01/24/15 1840  WBC 9.9  HGB 11.7*  HCT 35.3  PLT 239   ------------------------------------------------------------------------------------------------------------------  Chemistries   Recent Labs Lab 01/24/15 1840  NA 142  K 4.1  CL 111  CO2 26  GLUCOSE 97  BUN 25*  CREATININE 0.61  CALCIUM 9.2   ------------------------------------------------------------------------------------------------------------------  Cardiac Enzymes No results for input(s): TROPONINI in the last 168 hours. ------------------------------------------------------------------------------------------------------------------  RADIOLOGY:  Ct Head Wo Contrast  01/24/2015  CLINICAL DATA:  Trouble speaking and walking.  History of TIA. EXAM: CT HEAD WITHOUT CONTRAST TECHNIQUE: Contiguous axial images were obtained from the base of the skull through the vertex without intravenous contrast. COMPARISON:  04/20/2012 FINDINGS: Skull and Sinuses:Unremarkable appearance of left anterior craniotomy site for unknown indication. No acute fracture. No destructive process. No visualized sinusitis or mastoiditis. Orbits: Bilateral cataract resection. Brain: No evidence of acute infarction, hemorrhage, hydrocephalus, or mass lesion/mass effect. There is a chronic lacunar infarct in the left thalamus. Chronic small vessel infarct in the upper left cerebellum. Mild generalized cerebral volume loss, typical for age. Chronic ischemic gliosis present around the lateral ventricles, stable. IMPRESSION: No acute finding or change from 2014. Electronically Signed   By: Monte Fantasia M.D.   On: 01/24/2015 19:23   Mr Brain Wo Contrast  01/25/2015  CLINICAL DATA:  Acute presentation with difficulty walking and speech disturbance, 1 day duration. EXAM: MRI HEAD WITHOUT CONTRAST TECHNIQUE: Multiplanar, multiecho pulse sequences of the brain and surrounding structures were obtained without intravenous contrast. COMPARISON:  Head CT  01/24/2015 FINDINGS: There is acute infarction within the left thalamus. No other acute infarction. No evidence of mass effect or hemorrhage in that region. There chronic  small-vessel ischemic changes affecting the pons. 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 There are moderate changes of chronic small vessel disease affecting the cerebral deep and subcortical white matter. No large vessel territory infarction. There has been left frontal craniotomy. There is some artifact related to that. No evidence of neoplastic mass lesion, acute hemorrhage, hydrocephalus or extra-axial collection. No pituitary mass. No inflammatory sinus disease. No skull or skullbase lesion. IMPRESSION: Acute infarction within the left thalamus. No mass effect or hemorrhage. Chronic small vessel ischemic changes elsewhere throughout the brain. Electronically Signed   By: Nelson Chimes M.D.   On: 01/25/2015 14:10   US Carotid Bilateral  01/25/2015  CLINICAL DATA:  TIA, hypertension, coronary artery disease EXAM: BILATERAL CAROTID DUPLEX ULTRASOUND TECHNIQUE: Pearline Cables scale imaging, color Doppler and duplex ultrasound was performed of bilateral carotid and vertebral arteries in the neck. COMPARISON:  None. REVIEW OF SYSTEMS: Quantification of carotid stenosis is based on velocity parameters that correlate the residual internal carotid diameter with NASCET-based stenosis levels, using the diameter of the distal internal carotid lumen as the denominator for stenosis measurement. The following velocity measurements were obtained: PEAK SYSTOLIC/END DIASTOLIC RIGHT ICA:                     98/24cm/sec CCA:                     [id number] SYSTOLIC ICA/CCA RATIO:  0.7 DIASTOLIC ICA/CCA RATIO: 1.3 ECA:                     77cm/sec LEFT ICA:                     93/16cm/sec CCA:                     [id number] SYSTOLIC ICA/CCA RATIO:  1.3 DIASTOLIC ICA/CCA RATIO: 1.2 ECA:                     94cm/sec FINDINGS: RIGHT CAROTID ARTERY: Mild  smooth eccentric plaque at the carotid bifurcation without high-grade stenosis. Normal waveforms and color Doppler signal. RIGHT VERTEBRAL ARTERY:  Normal flow direction and waveform. LEFT CAROTID ARTERY: Mild eccentric noncalcified plaque at the carotid bifurcation and in the proximal ICA, without high-grade stenosis. Normal waveforms and color Doppler signal. LEFT VERTEBRAL ARTERY: Normal flow direction and waveform. IMPRESSION: 1. Mild bilateral carotid bifurcation plaque resulting in less than 50% diameter stenosis. The exam does not exclude plaque ulceration or embolization. Continued surveillance recommended. Electronically Signed   By: Lucrezia Europe M.D.   On: 01/25/2015 09:26    EKG:   Orders placed or performed during the hospital encounter of 01/24/15  . ED EKG  . ED EKG  . EKG 12-Lead  . EKG 12-Lead    ASSESSMENT AND PLAN:   1. Acute left thalamic infarct - At this point she is almost asymptomatic, has been cleared by speech, occupational and physical therapy - Had been on aspirin 81 mg daily, I will add Plavix and increase her statin - Carotid Doppler shows mild bilateral plaque, no significant stenosis - Echo is pending - EKG with bradycardia, no atrial fibrillation, no events on telemetry - Neurology consultation pending  2. Hypertension - Blood pressure has been fairly well controlled today. Continue Avapro, metoprolol  3. Hyperthyroidism - Continue methimazole, TSH is normal  4. Hyperlipidemia - Have increased to higher potency statin. LDL is 70  All the records are reviewed and case discussed with Care Management/Social Workerr. Management plans discussed with the patient, family and they are in agreement. Discussed care plan with the patient, her daughter and her niece  CODE STATUS: Full  TOTAL TIME TAKING CARE OF THIS PATIENT: 35 minutes.  Greater than 50% of time spent in care coordination and counseling. POSSIBLE D/C IN 1 DAYS, DEPENDING ON CLINICAL  CONDITION.   Myrtis Ser M.D on 01/25/2015 at 4:44 PM  Between 7am to 6pm - Pager - [phone]  After 6pm go to [email or web address] - password [email or web address] [facility]  [facility]  Office  [phone]  CC: Primary care physician; Adin Hector, MD

## 2015-01-25 NOTE — Progress Notes (Signed)
pts BP 149/51 and 146/58.  MD aware.  No changes at this time.  Pts NIH is 0 and no neuro deficits noted.

## 2015-01-25 NOTE — Progress Notes (Signed)
Speech Therapy Note: received order, reviewed chart notes and consulted NSG then met w/ pt and family members. Pt is alert and oriented x3, verbally conversive at conversational level, and following all instruction appropriately. Noted is a mild hesitency, dysfluency, of speech intermittently during challenge questions; during more casual conversation, pt's fluency was appropriate. Speech was 100% intelligible/clear. Pt consumed sips of water while taking her pills w/ NSG while in room w/ no swallowing deficits noted. Pt and family denied any trouble swallowing.  Due to pt's high level of presentation, close to her baseline per pt, no skilled ST services rec'd while admitted. Would rec. F/u at discharge w/ Outpt ST services for any dysfluency of speech, expressive deficits. Pt and family agreed. MD and NSG/CM updated. NSG to reconsult ST if any change in status while admitted.

## 2015-01-25 NOTE — Plan of Care (Signed)
Problem: Education: Goal: Knowledge of disease or condition will improve Outcome: Progressing Reviewed stroke book with pt and family and answered questions about treatment and upcoming procedures.  Explained the NIH scoring system and why each task is performed.      Problem: Education: Goal: Knowledge of [facility] Education information/materials will improve Outcome: Progressing Introduced welcome packet to pt and family.  Oriented to room.  Questions answered.  Problem: Safety: Goal: Ability to remain free from injury will improve Outcome: Progressing Explained to pt and family the importance of remaining in the bed.  Bed alarm on, bed in lowest position, call bell and phone within reach.  Daughter at bedside.

## 2015-01-25 NOTE — Plan of Care (Signed)
Problem: Education: Goal: Knowledge of disease or condition will improve Outcome: Progressing Educated pt about the importance of mobility. Pt walked in the hall with PT today, as well as up to the bathroom with standby assist. Educate pt about stroke symptoms and stroke prevention. Pt verbalized understanding.  Problem: Education: Goal: Knowledge of [facility] Education information/materials will improve Outcome: Progressing As above. Educated pt about high fall risk precautions. Pt uses the call bell and remained free of injury during the shift. Plan of care reviewed with pt and family. Educated pt about plavix and atorvastatin. Pt verbalized understanding.  Problem: Safety: Goal: Ability to remain free from injury will improve Outcome: Progressing As above.   VSS. Denies pain. No neuro changes noted during the shift. NIH score 0.

## 2015-01-25 NOTE — Progress Notes (Signed)
Unable to do overnight oximeter. Informed Rn, oximeter is in use with another pt.

## 2015-01-26 DIAGNOSIS — G459 Transient cerebral ischemic attack, unspecified: Secondary | ICD-10-CM | POA: Diagnosis not present

## 2015-01-26 LAB — BASIC METABOLIC PANEL
ANION GAP: 5 (ref 5–15)
BUN: 12 mg/dL (ref 6–20)
CALCIUM: 8.7 mg/dL — AB (ref 8.9–10.3)
CHLORIDE: 110 mmol/L (ref 101–111)
CO2: 26 mmol/L (ref 22–32)
Creatinine, Ser: 0.59 mg/dL (ref 0.44–1.00)
GFR calc non Af Amer: >60 mL/min (ref >60)
GLUCOSE: 104 mg/dL — AB (ref 65–99)
POTASSIUM: 3.7 mmol/L (ref 3.5–5.1)
Sodium: 141 mmol/L (ref 135–145)

## 2015-01-26 LAB — CBC
HEMATOCRIT: 33.6 % — AB (ref 35.0–47.0)
HEMOGLOBIN: 11.2 g/dL — AB (ref 12.0–16.0)
MCH: 31.5 pg (ref 26.0–34.0)
MCHC: 33.3 g/dL (ref 32.0–36.0)
MCV: 94.7 fL (ref 80.0–100.0)
Platelets: 210 10*3/uL (ref 150–440)
RBC: 3.55 MIL/uL — AB (ref 3.80–5.20)
RDW: 13.4 % (ref 11.5–14.5)
WBC: 7.8 10*3/uL (ref 3.6–11.0)

## 2015-01-26 LAB — GLUCOSE, CAPILLARY
GLUCOSE-CAPILLARY: 96 mg/dL (ref 65–99)
Glucose-Capillary: 100 mg/dL — ABNORMAL HIGH (ref 65–99)

## 2015-01-26 MED ORDER — CLOPIDOGREL BISULFATE 75 MG PO TABS: 75.0000 mg | ORAL_TABLET | Freq: Every day | ORAL | Status: AC

## 2015-01-26 MED ORDER — LISINOPRIL 5 MG PO TABS: 5.0000 mg | ORAL_TABLET | Freq: Every day | ORAL | Status: DC

## 2015-01-26 MED ORDER — ATORVASTATIN CALCIUM 40 MG PO TABS: 40.0000 mg | ORAL_TABLET | Freq: Every day | ORAL | Status: AC

## 2015-01-26 NOTE — Care Management Obs Status (Signed)
[facility] NOTIFICATION   Patient Details  Name: ADALIND GULAS MRN: [id number] Date of Birth: 06/22/1931   Medicare Observation Status Notification Given:  Yes    Marshell Garfinkel, RN 01/26/2015, 11:50 AM

## 2015-01-26 NOTE — Discharge Summary (Signed)
[facility] [facility] [place]    MR#:  [id number]  DATE OF BIRTH:  10/03/1931  DATE OF ADMISSION:  01/24/2015 ADMITTING PHYSICIAN: Lytle Butte, MD  DATE OF DISCHARGE: 01/26/2015  4:40 PM  PRIMARY CARE PHYSICIAN: Tama High III, MD    ADMISSION DIAGNOSIS:  Slurred speech [R47.81] Imbalance [R26.89] Blurred vision, bilateral [H53.8] Cerebral infarction due to unspecified mechanism [I63.9]  DISCHARGE DIAGNOSIS:  Principal Problem:   TIA (transient ischemic attack)   SECONDARY DIAGNOSIS:   Past Medical History  Diagnosis Date  . TIA (transient ischemic attack)   . Hypertension     HOSPITAL COURSE:   81 year old female with past medical history of hypertension, hyperparathyroidism, hyperlipidemia, who presented to the hospital with speech disturbance and noted to have an acute stroke.  1. Acute left thalamic infarct-this was noted on MRI of the brain. Patient did have transient neurologic symptoms but they have much improved during the hospital course. -Neurology consult was obtained and most likely this was secondary to uncontrolled hypertension. -Patient was on aspirin and was switched over to just Plavix. Her statin was increased to a high intensity statin. A carotid duplex was negative. Her echocardiogram result is still pending to be followed as an outpatient. -She was discharged home with close follow-up with nephrology as an outpatient.  2. Hypertension-her blood pressure was somewhat labile. -She will continue her verapamil and metoprolol. I added low-dose lisinopril.  3. Hyperthyroidism - Continue methimazole, TSH is normal  4. Hyperlipidemia - She was changed over to a high intensity statin.  Atorvastatin.   5. Nocturnal Hypoxia - pt's O2 sats dropped to low 80's while in the hospital at night.  - she ws discharged on O2 at nights with overnight oximetry.  She needs an official sleep study as an  outpatient.   DISCHARGE CONDITIONS:   Stable  CONSULTS OBTAINED:  Treatment Team:  Lytle Butte, MD  DRUG ALLERGIES:   Allergies  Allergen Reactions  . Sulfa Antibiotics Hives    DISCHARGE MEDICATIONS:   Discharge Medication List as of 01/26/2015  2:32 PM    START taking these medications   Details  atorvastatin (LIPITOR) 40 MG tablet Take 1 tablet (40 mg total) by mouth daily at 6 PM., Starting 01/26/2015, Until Discontinued, Print    clopidogrel (PLAVIX) 75 MG tablet Take 1 tablet (75 mg total) by mouth daily., Starting 01/26/2015, Until Discontinued, Print    lisinopril (PRINIVIL,ZESTRIL) 5 MG tablet Take 1 tablet (5 mg total) by mouth daily., Starting 01/26/2015, Until Discontinued, Print      CONTINUE these medications which have NOT CHANGED   Details  Calcium Carbonate-Vitamin D (CALCIUM 600+D) 600-400 MG-UNIT tablet Take 1 tablet by mouth daily. , Until Discontinued, Historical Med    citalopram (CELEXA) 20 MG tablet Take 20 mg by mouth daily., Until Discontinued, Historical Med    irbesartan (AVAPRO) 300 MG tablet Take 300 mg by mouth daily., Until Discontinued, Historical Med    methimazole (TAPAZOLE) 5 MG tablet Take 2.5 mg by mouth daily., Until Discontinued, Historical Med    metoprolol succinate (TOPROL-XL) 25 MG 24 hr tablet Take 12.5 mg by mouth at bedtime. , Until Discontinued, Historical Med    Multiple Vitamin (MULTIVITAMIN WITH MINERALS) TABS tablet Take 1 tablet by mouth daily., Until Discontinued, Historical Med    nitroGLYCERIN (NITROSTAT) 0.4 MG SL tablet Place 0.4 mg under the tongue every 5 (five) minutes as needed for chest  pain., Until Discontinued, Historical Med      STOP taking these medications     aspirin EC 81 MG tablet      simvastatin (ZOCOR) 40 MG tablet          DISCHARGE INSTRUCTIONS:   DIET:  Cardiac diet  DISCHARGE CONDITION:  Stable  ACTIVITY:  Activity as tolerated  OXYGEN:  Home Oxygen: Yes.     Oxygen  Delivery: 2 liters/min via Patient connected to nasal cannula oxygen  DISCHARGE LOCATION:  home   If you experience worsening of your admission symptoms, develop shortness of breath, life threatening emergency, suicidal or homicidal thoughts you must seek medical attention immediately by calling 911 or calling your MD immediately  if symptoms less severe.  You Must read complete instructions/literature along with all the possible adverse reactions/side effects for all the Medicines you take and that have been prescribed to you. Take any new Medicines after you have completely understood and accpet all the possible adverse reactions/side effects.   Please note  You were cared for by a hospitalist during your hospital stay. If you have any questions about your discharge medications or the care you received while you were in the hospital after you are discharged, you can call the unit and asked to speak with the hospitalist on call if the hospitalist that took care of you is not available. Once you are discharged, your primary care physician will handle any further medical issues. Please note that NO REFILLS for any discharge medications will be authorized once you are discharged, as it is imperative that you return to your primary care physician (or establish a relationship with a primary care physician if you do not have one) for your aftercare needs so that they can reassess your need for medications and monitor your lab values.     Today    Aphasia, headache, nausea, vomiting. Patient's O2 sats at night had dropped to the low 80s and family is a bit concerned.  VITAL SIGNS:  Blood pressure 145/60, pulse 60, temperature 97.5 F (36.4 C), temperature source Oral, resp. rate 19, height 5\' 5"  (1.651 m), weight 87.635 kg (193 lb 3.2 oz), SpO2 95 %.  I/O:   Intake/Output Summary (Last 24 hours) at 01/26/15 1659 Last data filed at 01/26/15 1500  Gross per 24 hour  Intake    480 ml  Output    1000 ml  Net   -520 ml    PHYSICAL EXAMINATION:  GENERAL:  81 y.o.-year-old patient lying in the bed with no acute distress.  EYES: Pupils equal, round, reactive to light and accommodation. No scleral icterus. Extraocular muscles intact.  HEENT: Head atraumatic, normocephalic. Oropharynx and nasopharynx clear.  NECK:  Supple, no jugular venous distention. No thyroid enlargement, no tenderness.  LUNGS: Normal breath sounds bilaterally, no wheezing, rales,rhonchi. No use of accessory muscles of respiration.  CARDIOVASCULAR: S1, S2 normal. II/VI SEM at LSB, rubs, or gallops.  ABDOMEN: Soft, non-tender, non-distended. Bowel sounds present. No organomegaly or mass.  EXTREMITIES: No pedal edema, cyanosis, or clubbing.  NEUROLOGIC: Cranial nerves II through XII are intact. No focal motor or sensory defecits b/l.  PSYCHIATRIC: The patient is alert and oriented x 3. Good affect.  SKIN: No obvious rash, lesion, or ulcer.   DATA REVIEW:   CBC  Recent Labs Lab 01/26/15 0737  WBC 7.8  HGB 11.2*  HCT 33.6*  PLT 210    Chemistries   Recent Labs Lab 01/26/15 0737  NA 141  K 3.7  CL 110  CO2 26  GLUCOSE 104*  BUN 12  CREATININE 0.59  CALCIUM 8.7*    Cardiac Enzymes No results for input(s): TROPONINI in the last 168 hours.  Microbiology Results  No results found for this or any previous visit.  RADIOLOGY:  Ct Head Wo Contrast  01/24/2015  CLINICAL DATA:  Trouble speaking and walking.  History of TIA. EXAM: CT HEAD WITHOUT CONTRAST TECHNIQUE: Contiguous axial images were obtained from the base of the skull through the vertex without intravenous contrast. COMPARISON:  04/20/2012 FINDINGS: Skull and Sinuses:Unremarkable appearance of left anterior craniotomy site for unknown indication. No acute fracture. No destructive process. No visualized sinusitis or mastoiditis. Orbits: Bilateral cataract resection. Brain: No evidence of acute infarction, hemorrhage, hydrocephalus, or mass  lesion/mass effect. There is a chronic lacunar infarct in the left thalamus. Chronic small vessel infarct in the upper left cerebellum. Mild generalized cerebral volume loss, typical for age. Chronic ischemic gliosis present around the lateral ventricles, stable. IMPRESSION: No acute finding or change from 2014. Electronically Signed   By: Monte Fantasia M.D.   On: 01/24/2015 19:23   Mr Brain Wo Contrast  01/25/2015  CLINICAL DATA:  Acute presentation with difficulty walking and speech disturbance, 1 day duration. EXAM: MRI HEAD WITHOUT CONTRAST TECHNIQUE: Multiplanar, multiecho pulse sequences of the brain and surrounding structures were obtained without intravenous contrast. COMPARISON:  Head CT 01/24/2015 FINDINGS: There is acute infarction within the left thalamus. No other acute infarction. No evidence of mass effect or hemorrhage in that region. There chronic small-vessel ischemic changes affecting the pons. 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 There are moderate changes of chronic small vessel disease affecting the cerebral deep and subcortical white matter. No large vessel territory infarction. There has been left frontal craniotomy. There is some artifact related to that. No evidence of neoplastic mass lesion, acute hemorrhage, hydrocephalus or extra-axial collection. No pituitary mass. No inflammatory sinus disease. No skull or skullbase lesion. IMPRESSION: Acute infarction within the left thalamus. No mass effect or hemorrhage. Chronic small vessel ischemic changes elsewhere throughout the brain. Electronically Signed   By: Nelson Chimes M.D.   On: 01/25/2015 14:10   US Carotid Bilateral  01/25/2015  CLINICAL DATA:  TIA, hypertension, coronary artery disease EXAM: BILATERAL CAROTID DUPLEX ULTRASOUND TECHNIQUE: Pearline Cables scale imaging, color Doppler and duplex ultrasound was performed of bilateral carotid and vertebral arteries in the neck. COMPARISON:  None. REVIEW OF SYSTEMS:  Quantification of carotid stenosis is based on velocity parameters that correlate the residual internal carotid diameter with NASCET-based stenosis levels, using the diameter of the distal internal carotid lumen as the denominator for stenosis measurement. The following velocity measurements were obtained: PEAK SYSTOLIC/END DIASTOLIC RIGHT ICA:                     98/24cm/sec CCA:                     [id number] SYSTOLIC ICA/CCA RATIO:  0.7 DIASTOLIC ICA/CCA RATIO: 1.3 ECA:                     77cm/sec LEFT ICA:                     93/16cm/sec CCA:                     [id number] SYSTOLIC ICA/CCA RATIO:  1.3 DIASTOLIC ICA/CCA RATIO: 1.2 ECA:  94cm/sec FINDINGS: RIGHT CAROTID ARTERY: Mild smooth eccentric plaque at the carotid bifurcation without high-grade stenosis. Normal waveforms and color Doppler signal. RIGHT VERTEBRAL ARTERY:  Normal flow direction and waveform. LEFT CAROTID ARTERY: Mild eccentric noncalcified plaque at the carotid bifurcation and in the proximal ICA, without high-grade stenosis. Normal waveforms and color Doppler signal. LEFT VERTEBRAL ARTERY: Normal flow direction and waveform. IMPRESSION: 1. Mild bilateral carotid bifurcation plaque resulting in less than 50% diameter stenosis. The exam does not exclude plaque ulceration or embolization. Continued surveillance recommended. Electronically Signed   By: Lucrezia Europe M.D.   On: 01/25/2015 09:26      Management plans discussed with the patient, family and they are in agreement.  CODE STATUS:     Code Status Orders        Start     Ordered   01/24/15 2048  Full code   Continuous     01/24/15 2048    Advance Directive Documentation        Most Recent Value   Type of Advance Directive  Healthcare Power of [place], Living will   Pre-existing out of facility DNR order (yellow form or pink MOST form)     "MOST" Form in Place?        TOTAL TIME TAKING CARE OF THIS PATIENT: 40 minutes.    Henreitta Leber  M.D on 01/26/2015 at 4:59 PM  Between 7am to 6pm - Pager - [phone]  After 6pm go to [email or web address] - password [email or web address] [facility]  [facility]  Office  [phone]  CC: Primary care physician; Adin Hector, MD

## 2015-01-26 NOTE — Progress Notes (Signed)
Pt and daughter given d/c instructions r/t activity, diet, followup care, medications and when to call MD> voiced understanding, pt and daughter given prescriptions x 3.  Pt d/c with family and staff via wheelchair.

## 2015-01-26 NOTE — Consult Note (Signed)
Reason for Consult:  stroke Referring Physician:  Dr. Judeen Hammans Haley Rollins is an 81 y.o. female.  HPI:  81 yo RHD F presents to [facility] due to difficulty speaking and problems getting her words out.  Symptoms started the day before presentation so she was not a candidate for tPA.  Pt denies any weakness, vision changes or numbness.  Pt also denies headaches.  Speech has improved since she got here.  Past Medical History  Diagnosis Date  . TIA (transient ischemic attack)   . Hypertension     Past Surgical History  Procedure Laterality Date  . Brain surgery      Family History  Problem Relation Age of Onset  . Diabetes Neg Hx     Social History:  reports that she has quit smoking. Her smoking use included Cigarettes. She does not have any smokeless tobacco history on file. She reports that she drinks about 0.6 - 1.2 oz of alcohol per week. She reports that she does not use illicit drugs.  Allergies:  Allergies  Allergen Reactions  . Sulfa Antibiotics Hives    Medications: personally reviewed by me  Results for orders placed or performed during the hospital encounter of 01/24/15 (from the past 48 hour(s))  Basic metabolic panel     Status: Abnormal   Collection Time: 01/24/15  6:40 PM  Result Value Ref Range   Sodium 142 135 - 145 mmol/L   Potassium 4.1 3.5 - 5.1 mmol/L   Chloride 111 101 - 111 mmol/L   CO2 26 22 - 32 mmol/L   Glucose, Bld 97 65 - 99 mg/dL   BUN 25 (H) 6 - 20 mg/dL   Creatinine, Ser 0.61 0.44 - 1.00 mg/dL   Calcium 9.2 8.9 - 10.3 mg/dL   GFR calc non Af Amer >60 >60 mL/min   GFR calc Af Amer >60 >60 mL/min    Comment: (NOTE) The eGFR has been calculated using the CKD EPI equation. This calculation has not been validated in all clinical situations. eGFR's persistently <60 mL/min signify possible Chronic Kidney Disease.    Anion gap 5 5 - 15  CBC     Status: Abnormal   Collection Time: 01/24/15  6:40 PM  Result Value Ref Range   WBC 9.9 3.6 - 11.0  K/uL   RBC 3.73 (L) 3.80 - 5.20 MIL/uL   Hemoglobin 11.7 (L) 12.0 - 16.0 g/dL   HCT 35.3 35.0 - 47.0 %   MCV 94.7 80.0 - 100.0 fL   MCH 31.2 26.0 - 34.0 pg   MCHC 33.0 32.0 - 36.0 g/dL   RDW 13.4 11.5 - 14.5 %   Platelets 239 150 - 440 K/uL  Urinalysis complete, with microscopic ([facility] only)     Status: Abnormal   Collection Time: 01/24/15  7:54 PM  Result Value Ref Range   Color, Urine YELLOW (A) YELLOW   APPearance HAZY (A) CLEAR   Glucose, UA NEGATIVE NEGATIVE mg/dL   Bilirubin Urine NEGATIVE NEGATIVE   Ketones, ur 1+ (A) NEGATIVE mg/dL   Specific Gravity, Urine 1.027 1.005 - 1.030   Hgb urine dipstick NEGATIVE NEGATIVE   pH 5.0 5.0 - 8.0   Protein, ur NEGATIVE NEGATIVE mg/dL   Nitrite NEGATIVE NEGATIVE   Leukocytes, UA NEGATIVE NEGATIVE   RBC / HPF 0-5 0 - 5 RBC/hpf   WBC, UA 6-30 0 - 5 WBC/hpf   Bacteria, UA RARE (A) NONE SEEN   Squamous Epithelial / LPF 6-30 (A) NONE  SEEN   Mucous PRESENT    Ca Oxalate Crys, UA PRESENT   Hemoglobin A1c     Status: None   Collection Time: 01/25/15  5:49 AM  Result Value Ref Range   Hgb A1c MFr Bld 5.9 4.0 - 6.0 %  Lipid panel     Status: None   Collection Time: 01/25/15  5:49 AM  Result Value Ref Range   Cholesterol 135 0 - 200 mg/dL   Triglycerides 63 <150 mg/dL   HDL 52 >40 mg/dL   Total CHOL/HDL Ratio 2.6 RATIO   VLDL 13 0 - 40 mg/dL   LDL Cholesterol 70 0 - 99 mg/dL    Comment:        Total Cholesterol/HDL:CHD Risk Coronary Heart Disease Risk Table                     Men   Women  1/2 Average Risk   3.4   3.3  Average Risk       5.0   4.4  2 X Average Risk   9.6   7.1  3 X Average Risk  23.4   11.0        Use the calculated Patient Ratio above and the CHD Risk Table to determine the patient's CHD Risk.        ATP III CLASSIFICATION (LDL):  <100     mg/dL   Optimal  100-129  mg/dL   Near or Above                    Optimal  130-159  mg/dL   Borderline  160-189  mg/dL   High  >190     mg/dL   Very High   T4, free      Status: None   Collection Time: 01/25/15  5:49 AM  Result Value Ref Range   Free T4 0.98 0.61 - 1.12 ng/dL  TSH     Status: None   Collection Time: 01/25/15  5:49 AM  Result Value Ref Range   TSH 3.515 0.350 - 4.500 uIU/mL  CBC     Status: Abnormal   Collection Time: 01/26/15  7:37 AM  Result Value Ref Range   WBC 7.8 3.6 - 11.0 K/uL   RBC 3.55 (L) 3.80 - 5.20 MIL/uL   Hemoglobin 11.2 (L) 12.0 - 16.0 g/dL   HCT 33.6 (L) 35.0 - 47.0 %   MCV 94.7 80.0 - 100.0 fL   MCH 31.5 26.0 - 34.0 pg   MCHC 33.3 32.0 - 36.0 g/dL   RDW 13.4 11.5 - 14.5 %   Platelets 210 150 - 440 K/uL  Basic metabolic panel     Status: Abnormal   Collection Time: 01/26/15  7:37 AM  Result Value Ref Range   Sodium 141 135 - 145 mmol/L   Potassium 3.7 3.5 - 5.1 mmol/L   Chloride 110 101 - 111 mmol/L   CO2 26 22 - 32 mmol/L   Glucose, Bld 104 (H) 65 - 99 mg/dL   BUN 12 6 - 20 mg/dL   Creatinine, Ser 0.59 0.44 - 1.00 mg/dL   Calcium 8.7 (L) 8.9 - 10.3 mg/dL   GFR calc non Af Amer >60 >60 mL/min   GFR calc Af Amer >60 >60 mL/min    Comment: (NOTE) The eGFR has been calculated using the CKD EPI equation. This calculation has not been validated in all clinical situations. eGFR's persistently <60 mL/min signify possible Chronic Kidney  Disease.    Anion gap 5 5 - 15  Glucose, capillary     Status: Abnormal   Collection Time: 01/26/15  7:37 AM  Result Value Ref Range   Glucose-Capillary 100 (H) 65 - 99 mg/dL  Glucose, capillary     Status: None   Collection Time: 01/26/15 11:16 AM  Result Value Ref Range   Glucose-Capillary 96 65 - 99 mg/dL    Ct Head Wo Contrast  01/24/2015  CLINICAL DATA:  Trouble speaking and walking.  History of TIA. EXAM: CT HEAD WITHOUT CONTRAST TECHNIQUE: Contiguous axial images were obtained from the base of the skull through the vertex without intravenous contrast. COMPARISON:  04/20/2012 FINDINGS: Skull and Sinuses:Unremarkable appearance of left anterior craniotomy site for  unknown indication. No acute fracture. No destructive process. No visualized sinusitis or mastoiditis. Orbits: Bilateral cataract resection. Brain: No evidence of acute infarction, hemorrhage, hydrocephalus, or mass lesion/mass effect. There is a chronic lacunar infarct in the left thalamus. Chronic small vessel infarct in the upper left cerebellum. Mild generalized cerebral volume loss, typical for age. Chronic ischemic gliosis present around the lateral ventricles, stable. IMPRESSION: No acute finding or change from 2014. Electronically Signed   By: Monte Fantasia M.D.   On: 01/24/2015 19:23   Mr Brain Wo Contrast  01/25/2015  CLINICAL DATA:  Acute presentation with difficulty walking and speech disturbance, 1 day duration. EXAM: MRI HEAD WITHOUT CONTRAST TECHNIQUE: Multiplanar, multiecho pulse sequences of the brain and surrounding structures were obtained without intravenous contrast. COMPARISON:  Head CT 01/24/2015 FINDINGS: There is acute infarction within the left thalamus. No other acute infarction. No evidence of mass effect or hemorrhage in that region. There chronic small-vessel ischemic changes affecting the pons. 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 There are moderate changes of chronic small vessel disease affecting the cerebral deep and subcortical white matter. No large vessel territory infarction. There has been left frontal craniotomy. There is some artifact related to that. No evidence of neoplastic mass lesion, acute hemorrhage, hydrocephalus or extra-axial collection. No pituitary mass. No inflammatory sinus disease. No skull or skullbase lesion. IMPRESSION: Acute infarction within the left thalamus. No mass effect or hemorrhage. Chronic small vessel ischemic changes elsewhere throughout the brain. Electronically Signed   By: Nelson Chimes M.D.   On: 01/25/2015 14:10   US Carotid Bilateral  01/25/2015  CLINICAL DATA:  TIA, hypertension, coronary artery disease EXAM:  BILATERAL CAROTID DUPLEX ULTRASOUND TECHNIQUE: Pearline Cables scale imaging, color Doppler and duplex ultrasound was performed of bilateral carotid and vertebral arteries in the neck. COMPARISON:  None. REVIEW OF SYSTEMS: Quantification of carotid stenosis is based on velocity parameters that correlate the residual internal carotid diameter with NASCET-based stenosis levels, using the diameter of the distal internal carotid lumen as the denominator for stenosis measurement. The following velocity measurements were obtained: PEAK SYSTOLIC/END DIASTOLIC RIGHT ICA:                     98/24cm/sec CCA:                     [id number] SYSTOLIC ICA/CCA RATIO:  0.7 DIASTOLIC ICA/CCA RATIO: 1.3 ECA:                     77cm/sec LEFT ICA:                     93/16cm/sec CCA:  08/65HQ/ION SYSTOLIC ICA/CCA RATIO:  1.3 DIASTOLIC ICA/CCA RATIO: 1.2 ECA:                     94cm/sec FINDINGS: RIGHT CAROTID ARTERY: Mild smooth eccentric plaque at the carotid bifurcation without high-grade stenosis. Normal waveforms and color Doppler signal. RIGHT VERTEBRAL ARTERY:  Normal flow direction and waveform. LEFT CAROTID ARTERY: Mild eccentric noncalcified plaque at the carotid bifurcation and in the proximal ICA, without high-grade stenosis. Normal waveforms and color Doppler signal. LEFT VERTEBRAL ARTERY: Normal flow direction and waveform. IMPRESSION: 1. Mild bilateral carotid bifurcation plaque resulting in less than 50% diameter stenosis. The exam does not exclude plaque ulceration or embolization. Continued surveillance recommended. Electronically Signed   By: Lucrezia Europe M.D.   On: 01/25/2015 09:26    Review of Systems  Constitutional: Negative.   HENT: Negative.   Eyes: Negative.   Respiratory: Negative.   Cardiovascular: Negative.   Gastrointestinal: Negative.   Genitourinary: Negative.   Musculoskeletal: Negative.   Skin: Negative.   Neurological: Positive for speech change. Negative for dizziness,  tingling, tremors, sensory change, focal weakness, seizures and loss of consciousness.   Blood pressure 145/60, pulse 60, temperature 97.5 F (36.4 C), temperature source Oral, resp. rate 19, height 5' 5"  (1.651 m), weight 87.635 kg (193 lb 3.2 oz), SpO2 95 %. Physical Exam  Nursing note and vitals reviewed. Constitutional: She is oriented to person, place, and time. She appears well-developed and well-nourished. No distress.  HENT:  Head: Normocephalic and atraumatic.  Right Ear: External ear normal.  Left Ear: External ear normal.  Nose: Nose normal.  Mouth/Throat: Oropharynx is clear and moist.  Eyes: Conjunctivae and EOM are normal. Pupils are equal, round, and reactive to light. No scleral icterus.  Neck: Normal range of motion. Neck supple.  Cardiovascular: Normal rate, regular rhythm, normal heart sounds and intact distal pulses.   No murmur heard. Respiratory: Effort normal and breath sounds normal.  GI: Soft. Bowel sounds are normal.  Musculoskeletal: Normal range of motion. She exhibits no edema.  Neurological: She is alert and oriented to person, place, and time. She has normal reflexes. She displays normal reflexes. No cranial nerve deficit. She exhibits normal muscle tone. Coordination normal.  Skin: Skin is warm and dry. She is not diaphoretic.  Psychiatric: She has a normal mood and affect.   MRI of brain personally reviewed by me and shows an acute L BG lacunar infarct with some moderate white matter changes  Assessment/Plan: 1.  L BG lacunar infarct-  This is mildly symptomatic and due to uncontrolled HTN 2.  HTN-  Uncontrolled -  Needs better BP control with goal < 130/80 -  Continue lipitor 36m qHS -  D/c ASA -  Start Plavix 733mdaily -  Will sign off, please call with questions -  [facility]  Eline Geng 01/26/2015, 2:14 PM

## 2015-01-26 NOTE — Care Management (Signed)
Nocturnal O2 requested from [place].

## 2015-01-26 NOTE — Plan of Care (Signed)
Problem: Education: Goal: Knowledge of disease or condition will improve Outcome: Progressing Plan of care review with patient and daughter. Further education provided during stroke assessment. NIH remains at zero. Placed patient on 2 liters of oxygen due to oxygen saturations dipping down into the 80's during sleep. Pt heart rate in the Q000111Q with diastolic b/p in the Q000111Q as well. MD notified since pt on Metoprolol 12.5 mg at bedtime. Md order given to hold the bedtime dose. Pt with no complaints of pain. Up to BR with standby assist. Pt with some expiratory wheezes. Encouraged pt to cough and deep breath. Also, to increase ambulation in order to prevent pneumonia. Daughter remained at pt bedside throughout the night.   Problem: Education: Goal: Knowledge of [facility] Education information/materials will improve Outcome: Progressing Stroke recovery book with patient and family. No concerns verbalized r/t stroke. Ample time spent with pt and family to allow time for questions or concerns.   Problem: Safety: Goal: Ability to remain free from injury will improve Outcome: Progressing Pt moderate fall risk. Up standby assist. Steady gait. No impulsiveness. Pt encouraged to get up and move to prevent pneumonia, dvt and skin breakdown. SCD's in place.

## 2015-01-27 NOTE — Care Management (Signed)
Post discharge: ECHO unremarkable per Dr. Verdell Carmine. Still not chronic lung diagnosis. Patient notified by phone that her home O2 will remain uninsuranced. She has planned outpatient sleep study per patient.

## 2015-02-06 ENCOUNTER — Emergency Department
Admission: EM | Admit: 2015-02-06 | Discharge: 2015-02-06 | Disposition: A | Discharge status: Home / Self Care | Payer: Medicare Other | Attending: Emergency Medicine | Admitting: Emergency Medicine

## 2015-02-06 ENCOUNTER — Emergency Department: Payer: Medicare Other

## 2015-02-06 ENCOUNTER — Encounter: Payer: Self-pay | Admitting: Emergency Medicine

## 2015-02-06 DIAGNOSIS — R42 Dizziness and giddiness: Secondary | ICD-10-CM | POA: Diagnosis present

## 2015-02-06 DIAGNOSIS — Z79899 Other long term (current) drug therapy: Secondary | ICD-10-CM | POA: Insufficient documentation

## 2015-02-06 DIAGNOSIS — H8110 Benign paroxysmal vertigo, unspecified ear: Secondary | ICD-10-CM

## 2015-02-06 DIAGNOSIS — R001 Bradycardia, unspecified: Secondary | ICD-10-CM | POA: Diagnosis not present

## 2015-02-06 DIAGNOSIS — R51 Headache: Secondary | ICD-10-CM | POA: Insufficient documentation

## 2015-02-06 DIAGNOSIS — Z87891 Personal history of nicotine dependence: Secondary | ICD-10-CM | POA: Insufficient documentation

## 2015-02-06 DIAGNOSIS — H81399 Other peripheral vertigo, unspecified ear: Secondary | ICD-10-CM | POA: Diagnosis not present

## 2015-02-06 DIAGNOSIS — I10 Essential (primary) hypertension: Secondary | ICD-10-CM | POA: Insufficient documentation

## 2015-02-06 LAB — CBC WITH DIFFERENTIAL/PLATELET
Basophils Absolute: 0 10*3/uL (ref 0–0.1)
Basophils Relative: 0 %
Eosinophils Absolute: 0.2 10*3/uL (ref 0–0.7)
Eosinophils Relative: 3 %
HEMATOCRIT: 35.2 % (ref 35.0–47.0)
HEMOGLOBIN: 11.4 g/dL — AB (ref 12.0–16.0)
LYMPHS ABS: 2.7 10*3/uL (ref 1.0–3.6)
Lymphocytes Relative: 41 %
MCH: 30.5 pg (ref 26.0–34.0)
MCHC: 32.5 g/dL (ref 32.0–36.0)
MCV: 94.1 fL (ref 80.0–100.0)
MONOS PCT: 5 %
Monocytes Absolute: 0.3 10*3/uL (ref 0.2–0.9)
NEUTROS ABS: 3.4 10*3/uL (ref 1.4–6.5)
NEUTROS PCT: 51 %
Platelets: 224 10*3/uL (ref 150–440)
RBC: 3.74 MIL/uL — ABNORMAL LOW (ref 3.80–5.20)
RDW: 13.4 % (ref 11.5–14.5)
WBC: 6.6 10*3/uL (ref 3.6–11.0)

## 2015-02-06 LAB — COMPREHENSIVE METABOLIC PANEL WITH GFR
ALT: 17 U/L (ref 14–54)
AST: 26 U/L (ref 15–41)
Albumin: 3.7 g/dL (ref 3.5–5.0)
Alkaline Phosphatase: 69 U/L (ref 38–126)
Anion gap: 5 (ref 5–15)
BUN: 16 mg/dL (ref 6–20)
CO2: 26 mmol/L (ref 22–32)
Calcium: 9 mg/dL (ref 8.9–10.3)
Chloride: 108 mmol/L (ref 101–111)
Creatinine, Ser: 0.59 mg/dL (ref 0.44–1.00)
GFR calc Af Amer: >60 mL/min
GFR calc non Af Amer: >60 mL/min
Glucose, Bld: 119 mg/dL — ABNORMAL HIGH (ref 65–99)
Potassium: 4 mmol/L (ref 3.5–5.1)
Sodium: 139 mmol/L (ref 135–145)
Total Bilirubin: 0.9 mg/dL (ref 0.3–1.2)
Total Protein: 6.2 g/dL — ABNORMAL LOW (ref 6.5–8.1)

## 2015-02-06 LAB — TROPONIN I: Troponin I: <0.03 ng/mL

## 2015-02-06 MED ORDER — SODIUM CHLORIDE 0.9 % IV BOLUS (SEPSIS)
500.0000 mL | Freq: Once | INTRAVENOUS | Status: AC
  Administered 2015-02-06: 500 mL via INTRAVENOUS

## 2015-02-06 MED ORDER — MECLIZINE HCL 25 MG PO TABS: 25.0000 mg | ORAL_TABLET | Freq: Three times a day (TID) | ORAL | Status: DC | PRN

## 2015-02-06 MED ORDER — MECLIZINE HCL 25 MG PO TABS
25.0000 mg | ORAL_TABLET | Freq: Once | ORAL | Status: AC
  Administered 2015-02-06: 25 mg via ORAL
  Filled 2015-02-06: qty 1

## 2015-02-06 NOTE — ED Notes (Signed)
Patient transported to CT 

## 2015-02-06 NOTE — ED Notes (Signed)
Ems from home. Pt woke up at 0800 and was so dizzy that she could not stand and per daughter unable to understand pts speech. Speech has resolved. Pt still dizzy. S/p stroke 2 weeks ago.

## 2015-02-06 NOTE — ED Notes (Signed)
This nurse assisted patient to walk around room.  Pt walked with steady gait, states felt minor dizziness but much better from this morning. MD notified.

## 2015-02-06 NOTE — ED Provider Notes (Signed)
Time Seen: Approximately *11 AM I have reviewed the triage notes  Chief Complaint: Dizziness   History of Present Illness: Haley Rollins is a 81 y.o. female who was recently admitted here to the hospital for evaluation of a acute cerebrovascular accident. Patient states she's been doing fine without any significant residual issues. She states that she noticed this morning when she tried to get up that she felt dizzy. She describes her dizziness as feeling off balance and swimmy headed. She denies any focal weakness, trouble with speech or swallowing, visual disturbances, ear pain or deafness. She states the symptoms seem to occur when she gets up to ambulate and then resolve if she holds still and then occasionally exacerbated by movement of her head. She cannot ascertain if things are spinning in a clockwise or counterclockwise direction.  Patient was noticed in triage to be bradycardic. She is on Toprol but denies any changes in the dosage, etc. She denies any new medications. Past Medical History  Diagnosis Date  . TIA (transient ischemic attack)   . Hypertension     Patient Active Problem List   Diagnosis Date Noted  . TIA (transient ischemic attack) 01/24/2015    Past Surgical History  Procedure Laterality Date  . Brain surgery      Past Surgical History  Procedure Laterality Date  . Brain surgery      Current Outpatient Rx  Name  Route  Sig  Dispense  Refill  . atorvastatin (LIPITOR) 40 MG tablet   Oral   Take 1 tablet (40 mg total) by mouth daily at 6 PM.   60 tablet   1   . Calcium Carbonate-Vitamin D (CALCIUM 600+D) 600-400 MG-UNIT tablet   Oral   Take 1 tablet by mouth daily.          . citalopram (CELEXA) 20 MG tablet   Oral   Take 20 mg by mouth daily.         . clopidogrel (PLAVIX) 75 MG tablet   Oral   Take 1 tablet (75 mg total) by mouth daily.   60 tablet   1   . irbesartan (AVAPRO) 300 MG tablet   Oral   Take 300 mg by mouth daily.          Marland Kitchen lisinopril (PRINIVIL,ZESTRIL) 5 MG tablet   Oral   Take 1 tablet (5 mg total) by mouth daily.   30 tablet   1   . meclizine (ANTIVERT) 25 MG tablet   Oral   Take 1 tablet (25 mg total) by mouth 3 (three) times daily as needed for dizziness or nausea.   15 tablet   0   . methimazole (TAPAZOLE) 5 MG tablet   Oral   Take 2.5 mg by mouth daily.         . metoprolol succinate (TOPROL-XL) 25 MG 24 hr tablet   Oral   Take 12.5 mg by mouth at bedtime.          . Multiple Vitamin (MULTIVITAMIN WITH MINERALS) TABS tablet   Oral   Take 1 tablet by mouth daily.         . nitroGLYCERIN (NITROSTAT) 0.4 MG SL tablet   Sublingual   Place 0.4 mg under the tongue every 5 (five) minutes as needed for chest pain.           Allergies:  Sulfa antibiotics  Family History: Family History  Problem Relation Age of Onset  . Diabetes Neg Hx  Social History: Social History  Substance Use Topics  . Smoking status: Former Smoker    Types: Cigarettes  . Smokeless tobacco: None  . Alcohol Use: 0.6 - 1.2 oz/week    1-2 Glasses of wine per week     Review of Systems:   10 point review of systems was performed and was otherwise negative:  Constitutional: No fever Eyes: No visual disturbances ENT: No sore throat, ear pain Cardiac: No chest pain Respiratory: No shortness of breath, wheezing, or stridor Abdomen: No abdominal pain, no vomiting, No diarrhea Endocrine: No weight loss, No night sweats Extremities: No peripheral edema, cyanosis Skin: No rashes, easy bruising Neurologic: No focal weakness, trouble with speech or swollowing Urologic: No dysuria, Hematuria, or urinary frequency   Physical Exam:  ED Triage Vitals  Enc Vitals Group     BP 02/06/15 1013 129/67 mmHg     Pulse Rate 02/06/15 1013 48     Resp 02/06/15 1013 22     Temp 02/06/15 1013 97.5 F (36.4 C)     Temp Source 02/06/15 1013 Oral     SpO2 02/06/15 1013 89 %     Weight 02/06/15 1013  182 lb (82.555 kg)     Height 02/06/15 1013 5\' 3"  (1.6 m)     Head Cir --      Peak Flow --      Pain Score 02/06/15 1248 0     Pain Loc --      Pain Edu? --      Excl. in [place]? --     General: Awake , Alert , and Oriented times 3; GCS 15 Head: Normal cephalic , atraumatic Eyes: Pupils equal , round, reactive to light Nose/Throat: No nasal drainage, patent upper airway without erythema or exudate.  Neck: Supple, Full range of motion, No anterior adenopathy or palpable thyroid masses Lungs: Clear to ascultation without wheezes , rhonchi, or rales Heart: Bradycardia, regular rhythm without murmurs , gallops , or rubs Abdomen: Soft, non tender without rebound, guarding , or rigidity; bowel sounds positive and symmetric in all 4 quadrants. No organomegaly .        Extremities: 2 plus symmetric pulses. No edema, clubbing or cyanosis Neurologic: Patient had normal ambulation prior to departure from the emergency department, Motor symmetric without deficits, sensory intact. Negative drift or cerebellar signs. Skin: warm, dry, no rashes   Labs:   All laboratory work was reviewed including any pertinent negatives or positives listed below:  Labs Reviewed  CBC WITH DIFFERENTIAL/PLATELET - Abnormal; Notable for the following:    RBC 3.74 (*)    Hemoglobin 11.4 (*)    All other components within normal limits  COMPREHENSIVE METABOLIC PANEL - Abnormal; Notable for the following:    Glucose, Bld 119 (*)    Total Protein 6.2 (*)    All other components within normal limits  TROPONIN I   review of laboratory work shows no significant abnormalities  EKG:  ED ECG REPORT I, Daymon Larsen, the attending physician, personally viewed and interpreted this ECG.  Date: 02/06/2015 EKG Time: 1041 Rate: 48 Rhythm: normal sinus rhythm QRS Axis: normal Intervals: normal ST/T Wave abnormalities: normal Conduction Disutrbances: none Narrative Interpretation: unremarkable Left ventricular  hypertrophy   Radiology:  not stand and per daughter unable to understand pts speech. Speech has resolved. Pt still dizzy. Status post stroke 2 weeks ago. Hx brain surgery 2002.  EXAM: CT HEAD WITHOUT CONTRAST  TECHNIQUE: Contiguous axial images were obtained from  the base of the skull through the vertex without intravenous contrast.  COMPARISON: 01/25/2015, 01/24/2015  FINDINGS: There is no evidence of mass effect, midline shift, or extra-axial fluid collections. There is no evidence of a space-occupying lesion or intracranial hemorrhage. There is no evidence of a cortical-based area of acute infarction. There is a subtle subacute-chronic left thalamic lacunar infarct. There is generalized cerebral atrophy. There is periventricular white matter low attenuation likely secondary to microangiopathy.  The ventricles and sulci are appropriate for the patient's age. The basal cisterns are patent.  Visualized portions of the orbits are unremarkable. The visualized portions of the paranasal sinuses and mastoid air cells are unremarkable. Cerebrovascular atherosclerotic calcifications are noted.  There is evidence of prior left frontal craniotomy.  IMPRESSION: 1. No acute intracranial pathology. 2. Chronic microvascular disease and cerebral atrophy.      I personally reviewed the radiologic studies    ED Course:  Patient's stay here was uneventful and she was given Antivert with some symptomatic improvement states she feels "" a lot better "". Her exam does not show any focal deficits and based on her description this seems to be peripheral vertigo. I felt symptoms seem worse symptoms or not persistent and this is unlikely to be central vertigo. I felt this was unrelated to her recent stroke evaluation. Patient does not exhibit any signs of facial weakness or deafness. Patient's otherwise hemodynamically stable though found to be bradycardic and advised her family members to  hold the Toprol-XL until she can be seen by her cardiologist.    Assessment: * Peripheral vertigo   Final Clinical Impression: *  Final diagnoses:  Vertigo, benign paroxysmal, unspecified laterality     Plan: * Outpatient management Patient was advised to return immediately if condition worsens. Patient was advised to follow up with their primary care physician or other specialized physicians involved in their outpatient care             Daymon Larsen, MD 02/06/15 [phone]

## 2015-02-06 NOTE — Discharge Instructions (Signed)
Benign Positional Vertigo °Vertigo is the feeling that you or your surroundings are moving when they are not. Benign positional vertigo is the most common form of vertigo. The cause of this condition is not serious (is benign). This condition is triggered by certain movements and positions (is positional). This condition can be dangerous if it occurs while you are doing something that could endanger you or others, such as driving.  °CAUSES °In many cases, the cause of this condition is not known. It may be caused by a disturbance in an area of the inner ear that helps your brain to sense movement and balance. This disturbance can be caused by a viral infection (labyrinthitis), head injury, or repetitive motion. °RISK FACTORS °This condition is more likely to develop in: °· Women. °· People who are 50 years of age or older. °SYMPTOMS °Symptoms of this condition usually happen when you move your head or your eyes in different directions. Symptoms may start suddenly, and they usually last for less than a minute. Symptoms may include: °· Loss of balance and falling. °· Feeling like you are spinning or moving. °· Feeling like your surroundings are spinning or moving. °· Nausea and vomiting. °· Blurred vision. °· Dizziness. °· Involuntary eye movement (nystagmus). °Symptoms can be mild and cause only slight annoyance, or they can be severe and interfere with daily life. Episodes of benign positional vertigo may return (recur) over time, and they may be triggered by certain movements. Symptoms may improve over time. °DIAGNOSIS °This condition is usually diagnosed by medical history and a physical exam of the head, neck, and ears. You may be referred to a health care provider who specializes in ear, nose, and throat (ENT) problems (otolaryngologist) or a provider who specializes in disorders of the nervous system (neurologist). You may have additional testing, including: °· MRI. °· A CT scan. °· Eye movement tests. Your  health care provider may ask you to change positions quickly while he or she watches you for symptoms of benign positional vertigo, such as nystagmus. Eye movement may be tested with an electronystagmogram (ENG), caloric stimulation, the Dix-Hallpike test, or the roll test. °· An electroencephalogram (EEG). This records electrical activity in your brain. °· Hearing tests. °TREATMENT °Usually, your health care provider will treat this by moving your head in specific positions to adjust your inner ear back to normal. Surgery may be needed in severe cases, but this is rare. In some cases, benign positional vertigo may resolve on its own in 2-4 weeks. °HOME CARE INSTRUCTIONS °Safety °· Move slowly. Avoid sudden body or head movements. °· Avoid driving. °· Avoid operating heavy machinery. °· Avoid doing any tasks that would be dangerous to you or others if a vertigo episode would occur. °· If you have trouble walking or keeping your balance, try using a cane for stability. If you feel dizzy or unstable, sit down right away. °· Return to your normal activities as told by your health care provider. Ask your health care provider what activities are safe for you. °General Instructions °· Take over-the-counter and prescription medicines only as told by your health care provider. °· Avoid certain positions or movements as told by your health care provider. °· Drink enough fluid to keep your urine clear or pale yellow. °· Keep all follow-up visits as told by your health care provider. This is important. °SEEK MEDICAL CARE IF: °· You have a fever. °· Your condition gets worse or you develop new symptoms. °· Your family or friends   notice any behavioral changes. °· Your nausea or vomiting gets worse. °· You have numbness or a "pins and needles" sensation. °SEEK IMMEDIATE MEDICAL CARE IF: °· You have difficulty speaking or moving. °· You are always dizzy. °· You faint. °· You develop severe headaches. °· You have weakness in your  legs or arms. °· You have changes in your hearing or vision. °· You develop a stiff neck. °· You develop sensitivity to light. °  °This information is not intended to replace advice given to you by your health care provider. Make sure you discuss any questions you have with your health care provider. °  °Document Released: 11/12/2005 Document Revised: 10/26/2014 Document Reviewed: 05/30/2014 °Elsevier Interactive Patient Education ©2016 [employer] ° °Dizziness °Dizziness is a common problem. It is a feeling of unsteadiness or light-headedness. You may feel like you are about to faint. Dizziness can lead to injury if you stumble or fall. Anyone can become dizzy, but dizziness is more common in older adults. This condition can be caused by a number of things, including medicines, dehydration, or illness. °HOME CARE INSTRUCTIONS °Taking these steps may help with your condition: °Eating and Drinking °· Drink enough fluid to keep your urine clear or pale yellow. This helps to keep you from becoming dehydrated. Try to drink more clear fluids, such as water. °· Do not drink alcohol. °· Limit your caffeine intake if directed by your health care provider. °· Limit your salt intake if directed by your health care provider. °Activity °· Avoid making quick movements. °¨ Rise slowly from chairs and steady yourself until you feel okay. °¨ In the morning, first sit up on the side of the bed. When you feel okay, stand slowly while you hold onto something until you know that your balance is fine. °· Move your legs often if you need to stand in one place for a long time. Tighten and relax your muscles in your legs while you are standing. °· Do not drive or operate heavy machinery if you feel dizzy. °· Avoid bending down if you feel dizzy. Place items in your home so that they are easy for you to reach without leaning over. °Lifestyle °· Do not use any tobacco products, including cigarettes, chewing tobacco, or electronic  cigarettes. If you need help quitting, ask your health care provider. °· Try to reduce your stress level, such as with yoga or meditation. Talk with your health care provider if you need help. °General Instructions °· Watch your dizziness for any changes. °· Take medicines only as directed by your health care provider. Talk with your health care provider if you think that your dizziness is caused by a medicine that you are taking. °· Tell a friend or a family member that you are feeling dizzy. If he or she notices any changes in your behavior, have this person call your health care provider. °· Keep all follow-up visits as directed by your health care provider. This is important. °SEEK MEDICAL CARE IF: °· Your dizziness does not go away. °· Your dizziness or light-headedness gets worse. °· You feel nauseous. °· You have reduced hearing. °· You have new symptoms. °· You are unsteady on your feet or you feel like the room is spinning. °SEEK IMMEDIATE MEDICAL CARE IF: °· You vomit or have diarrhea and are unable to eat or drink anything. °· You have problems talking, walking, swallowing, or using your arms, hands, or legs. °· You feel generally weak. °· You are not   thinking clearly or you have trouble forming sentences. It may take a friend or family member to notice this. °· You have chest pain, abdominal pain, shortness of breath, or sweating. °· Your vision changes. °· You notice any bleeding. °· You have a headache. °· You have neck pain or a stiff neck. °· You have a fever. °  °This information is not intended to replace advice given to you by your health care provider. Make sure you discuss any questions you have with your health care provider. °  °Document Released: 07/31/2000 Document Revised: 06/21/2014 Document Reviewed: 01/31/2014 °Elsevier Interactive Patient Education ©2016 [employer] ° °

## 2015-10-14 ENCOUNTER — Emergency Department
Admission: EM | Admit: 2015-10-14 | Discharge: 2015-10-15 | Disposition: A | Discharge status: Home / Self Care | Payer: Medicare Other | Attending: Emergency Medicine | Admitting: Emergency Medicine

## 2015-10-14 ENCOUNTER — Encounter: Payer: Self-pay | Admitting: Emergency Medicine

## 2015-10-14 ENCOUNTER — Emergency Department: Payer: Medicare Other

## 2015-10-14 DIAGNOSIS — Z79899 Other long term (current) drug therapy: Secondary | ICD-10-CM | POA: Insufficient documentation

## 2015-10-14 DIAGNOSIS — S0093XA Contusion of unspecified part of head, initial encounter: Secondary | ICD-10-CM

## 2015-10-14 DIAGNOSIS — Y999 Unspecified external cause status: Secondary | ICD-10-CM | POA: Diagnosis not present

## 2015-10-14 DIAGNOSIS — Y92009 Unspecified place in unspecified non-institutional (private) residence as the place of occurrence of the external cause: Secondary | ICD-10-CM | POA: Diagnosis not present

## 2015-10-14 DIAGNOSIS — S0083XA Contusion of other part of head, initial encounter: Secondary | ICD-10-CM | POA: Insufficient documentation

## 2015-10-14 DIAGNOSIS — Z87891 Personal history of nicotine dependence: Secondary | ICD-10-CM | POA: Diagnosis not present

## 2015-10-14 DIAGNOSIS — Y9301 Activity, walking, marching and hiking: Secondary | ICD-10-CM | POA: Diagnosis not present

## 2015-10-14 DIAGNOSIS — I10 Essential (primary) hypertension: Secondary | ICD-10-CM | POA: Insufficient documentation

## 2015-10-14 DIAGNOSIS — S0990XA Unspecified injury of head, initial encounter: Secondary | ICD-10-CM | POA: Diagnosis present

## 2015-10-14 DIAGNOSIS — Z8673 Personal history of transient ischemic attack (TIA), and cerebral infarction without residual deficits: Secondary | ICD-10-CM | POA: Insufficient documentation

## 2015-10-14 DIAGNOSIS — W01198A Fall on same level from slipping, tripping and stumbling with subsequent striking against other object, initial encounter: Secondary | ICD-10-CM | POA: Diagnosis not present

## 2015-10-14 DIAGNOSIS — W19XXXA Unspecified fall, initial encounter: Secondary | ICD-10-CM

## 2015-10-14 DIAGNOSIS — S8002XA Contusion of left knee, initial encounter: Secondary | ICD-10-CM | POA: Diagnosis not present

## 2015-10-14 NOTE — ED Notes (Signed)
Pt. States at around 8;30 this evening pt. Tripped over power cord and hit head on window sill.  Pt. Denies LOC.  Pt. Has hematoma over lt. Eye.  Pt. Also has hematoma on lt. Knee.  Pt. Has full range of motion to lt. Knee.  Pt. Is on Plavix.  Pt. States feeling nauseated in route to ED, pt. Denies at this time.

## 2015-10-14 NOTE — ED Triage Notes (Signed)
Fell and hit head at 830pm - tripped. On plavix and feeling nauseous.

## 2015-10-14 NOTE — ED Notes (Signed)
Labs drawn and sent -

## 2015-10-15 ENCOUNTER — Emergency Department: Payer: Medicare Other

## 2015-10-15 DIAGNOSIS — S0083XA Contusion of other part of head, initial encounter: Secondary | ICD-10-CM | POA: Diagnosis not present

## 2015-10-15 NOTE — ED Notes (Signed)
Pt. Has hx of brain surgery and stroke in December 2016.

## 2015-10-15 NOTE — ED Provider Notes (Signed)
[facility] Emergency Department Provider Note  ____________________________________________  Time seen: Approximately 11:30 PM  I have reviewed the triage vital signs and the nursing notes.   HISTORY  Chief Complaint Fall    HPI Haley Rollins is a 82 y.o. female who reports she was walking across her house when she tripped over an electrical cord on the ground. This sent her revealing toward a window. She put her hand out to catch herself but did hit the left side of her forehead on the windowsill. She did not run into the glass. She also fell onto her left knee on the ground. She is able to stand up and bear weight. No vision changes numbness tingling weakness or neck pain. No loss of consciousness.     Past Medical History:  Diagnosis Date  . Hypertension   . TIA (transient ischemic attack)      Patient Active Problem List   Diagnosis Date Noted  . TIA (transient ischemic attack) 01/24/2015     Past Surgical History:  Procedure Laterality Date  . BRAIN SURGERY       Prior to Admission medications   Medication Sig Start Date End Date Taking? Authorizing Provider  atorvastatin (LIPITOR) 40 MG tablet Take 1 tablet (40 mg total) by mouth daily at 6 PM. 01/26/15   Henreitta Leber, MD  Calcium Carbonate-Vitamin D (CALCIUM 600+D) 600-400 MG-UNIT tablet Take 1 tablet by mouth daily.     Historical Provider, MD  citalopram (CELEXA) 20 MG tablet Take 20 mg by mouth daily.    Historical Provider, MD  clopidogrel (PLAVIX) 75 MG tablet Take 1 tablet (75 mg total) by mouth daily. 01/26/15   Henreitta Leber, MD  irbesartan (AVAPRO) 300 MG tablet Take 300 mg by mouth daily.    Historical Provider, MD  lisinopril (PRINIVIL,ZESTRIL) 5 MG tablet Take 1 tablet (5 mg total) by mouth daily. 01/26/15   Henreitta Leber, MD  meclizine (ANTIVERT) 25 MG tablet Take 1 tablet (25 mg total) by mouth 3 (three) times daily as needed for dizziness or nausea. 02/06/15   Daymon Larsen, MD  methimazole (TAPAZOLE) 5 MG tablet Take 2.5 mg by mouth daily.    Historical Provider, MD  metoprolol succinate (TOPROL-XL) 25 MG 24 hr tablet Take 12.5 mg by mouth at bedtime.     Historical Provider, MD  Multiple Vitamin (MULTIVITAMIN WITH MINERALS) TABS tablet Take 1 tablet by mouth daily.    Historical Provider, MD  nitroGLYCERIN (NITROSTAT) 0.4 MG SL tablet Place 0.4 mg under the tongue every 5 (five) minutes as needed for chest pain.    Historical Provider, MD     Allergies Sulfa antibiotics   Family History  Problem Relation Age of Onset  . Diabetes Neg Hx     Social History Social History  Substance Use Topics  . Smoking status: Former Smoker    Types: Cigarettes  . Smokeless tobacco: Never Used  . Alcohol use 0.6 - 1.2 oz/week    1 - 2 Glasses of wine per week     Comment: every other day    Review of Systems  Constitutional:   No fever or chills.  Cardiovascular:   No chest pain. Respiratory:   No dyspnea or cough. Gastrointestinal:   Negative for abdominal pain, vomiting and diarrhea.   Musculoskeletal:   Left knee pain Neurological:   Positive for headaches 10-point ROS otherwise negative.  ____________________________________________   PHYSICAL EXAM:  VITAL SIGNS: ED  Triage Vitals  Enc Vitals Group     BP 10/14/15 2146 (!) 170/62     Pulse Rate 10/14/15 2146 (!) 56     Resp --      Temp 10/14/15 2146 97.7 F (36.5 C)     Temp src --      SpO2 10/14/15 2146 100 %     Weight 10/14/15 2139 180 lb (81.6 kg)     Height 10/14/15 2139 5' 4.5" (1.638 m)     Head Circumference --      Peak Flow --      Pain Score 10/14/15 2139 6     Pain Loc --      Pain Edu? --      Excl. in [place]? --     Vital signs reviewed, nursing assessments reviewed.   Constitutional:   Alert and oriented. Well appearing and in no distress. Eyes:   No scleral icterus. No conjunctival pallor. PERRL. EOMI.  No nystagmus. ENT   Head:   NormocephalicWith  small scalp hematoma in the left forehead. No laceration..   Nose:   No congestion/rhinnorhea. No septal hematoma   Mouth/Throat:   MMM, no pharyngeal erythema. No peritonsillar mass. No intraoral injury   Neck:   No stridor. No SubQ emphysema. No meningismus. No midline tenderness, full range of motion Hematological/Lymphatic/Immunilogical:   No cervical lymphadenopathy. Cardiovascular:   RRR. Symmetric bilateral radial and DP pulses.  No murmurs.  Respiratory:   Normal respiratory effort without tachypnea nor retractions. Breath sounds are clear and equal bilaterally. No wheezes/rales/rhonchi. Gastrointestinal:   Soft and nontender. Non distended. There is no CVA tenderness.  No rebound, rigidity, or guarding. Genitourinary:   deferred Musculoskeletal:   Mild swelling around the left knee with some contusions around the proximal tibia anteriorly. No focal bony tenderness. Intact range of motion. No large effusion. Neurologic:   Normal speech and language.  CN 2-10 normal. Motor grossly intact. No gross focal neurologic deficits are appreciated.  Skin:    Skin is warm, dry and intact. No rash noted.  No petechiae, purpura, or bullae.  ____________________________________________    LABS (pertinent positives/negatives) (all labs ordered are listed, but only abnormal results are displayed) Labs Reviewed - No data to display ____________________________________________   EKG  Interpreted by me Sinus bradycardia rate of 57, normal axis and intervals. Poor R-wave progression in anterior precordial leads. Voltage criteria for LVH in the high lateral leads. Normal ST segments and T waves  ____________________________________________    RADIOLOGY  CT head unremarkable X-ray left knee unremarkable  ____________________________________________   PROCEDURES Procedures  ____________________________________________   INITIAL IMPRESSION / ASSESSMENT AND PLAN / ED  COURSE  Pertinent labs & imaging results that were available during my care of the patient were reviewed by me and considered in my medical decision making (see chart for details).  Patient well appearing no acute distress. Has some minor trauma after a mechanical fall. Imaging unremarkable. Patient counseled on concussion symptoms. Tylenol for her pain. Follow-up with primary care.     Clinical Course   ____________________________________________   FINAL CLINICAL IMPRESSION(S) / ED DIAGNOSES  Final diagnoses:  Fall, initial encounter  Head contusion, initial encounter  Knee contusion, left, initial encounter       Portions of this note were generated with dragon dictation software. Dictation errors may occur despite best attempts at proofreading.    Carrie Mew, MD 10/15/15 [phone]

## 2016-12-09 IMAGING — MR MR HEAD W/O CM
10 series · 48 of 48 positions shown · non-contrast
Comparison: Head CT 01/24/2015

CLINICAL DATA: Acute presentation with difficulty walking and
speech disturbance, 1 day duration.

EXAM:
MRI HEAD WITHOUT CONTRAST
TECHNIQUE: Multiplanar, multiecho pulse sequences of the brain and surrounding
structures were obtained without intravenous contrast.

[Series 2: T1 · sagittal · 5.0mm · 0.45mm/px · 4 of 27 slices shown (1 of 2)]
[im 1/27]
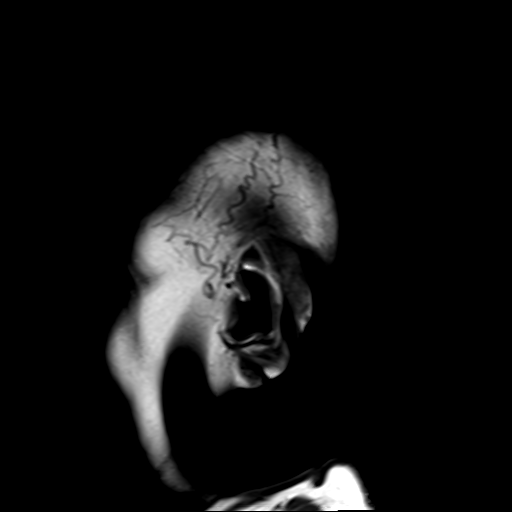
[im 9/27]
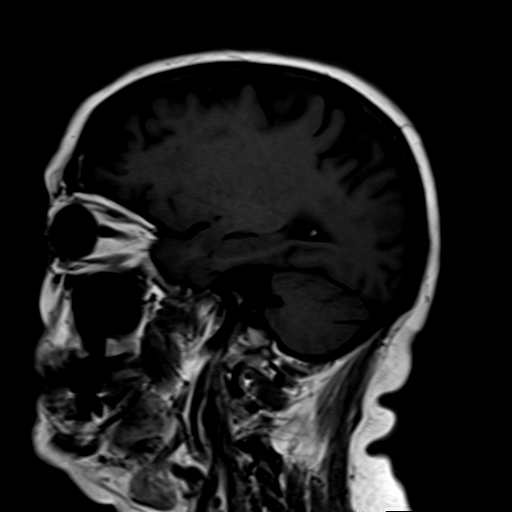
[im 18/27]
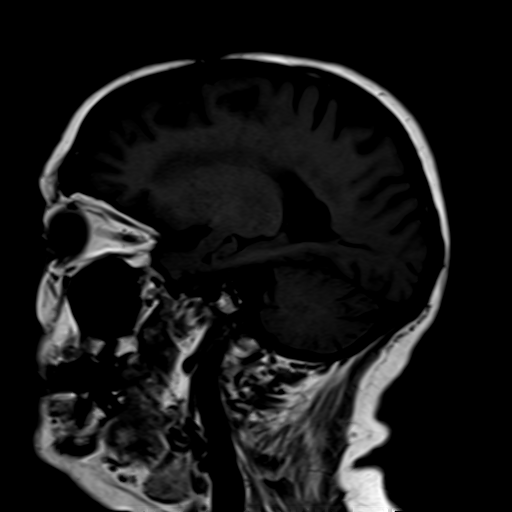
[im 27/27]
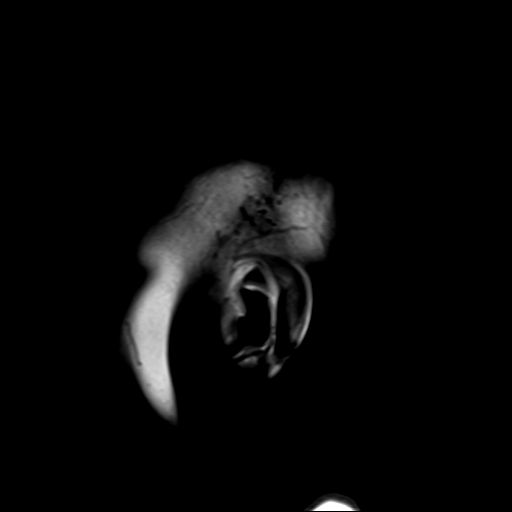

[Series 7: T2 · axial · 5.0mm · 0.60mm/px · z∈[-52,+104]mm · 3 of 25 slices shown (1 of 3)]
[im 1/25]
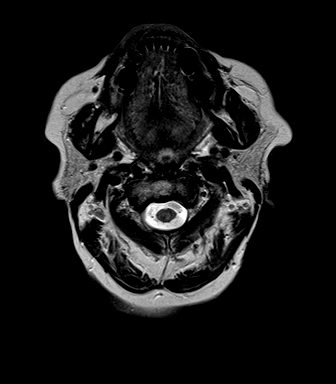
[im 13/25]
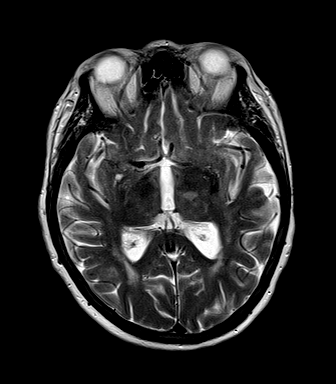
[im 25/25]
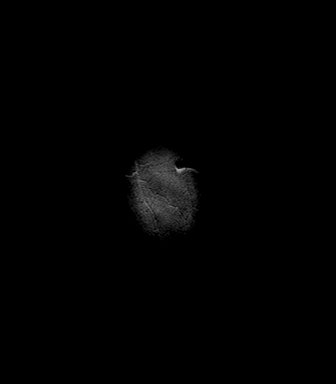

[Series 9: T2 · axial · 5.0mm · 0.45mm/px · z∈[-52,+104]mm · 3 of 25 slices shown (2 of 3)]
[im 1/25]
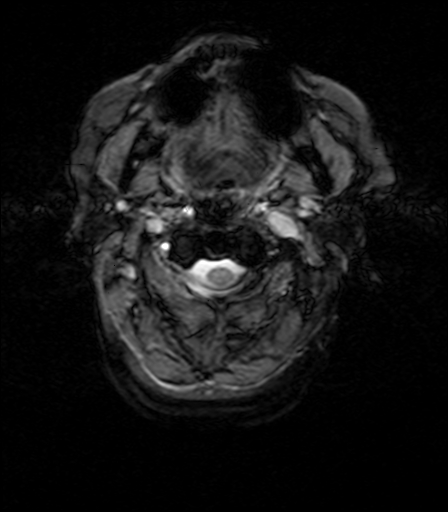
[im 13/25]
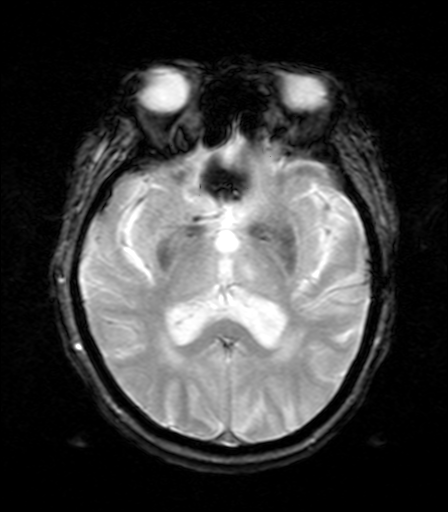
[im 25/25]
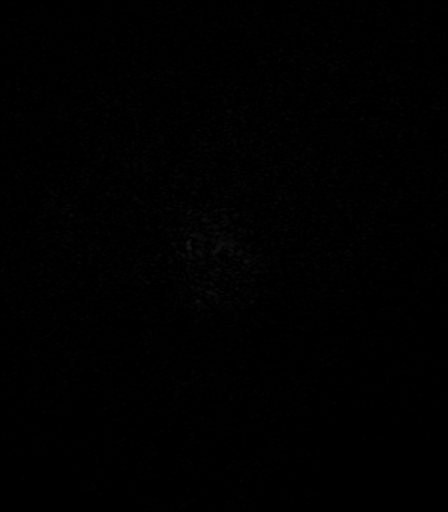

[Series 10: T1 · axial · 3.0mm · 1.00mm/px · z∈[-55,+109]mm · 7 of 56 slices shown (2 of 2)]
[im 1/56]
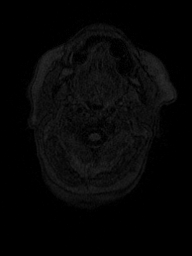
[im 10/56]
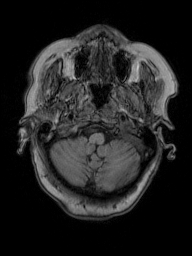
[im 19/56]
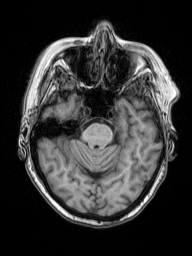
[im 28/56]
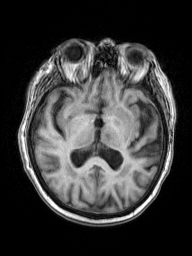
[im 37/56]
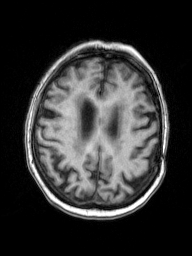
[im 46/56]
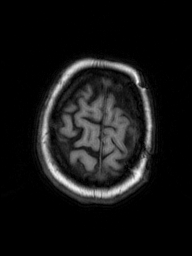
[im 56/56]
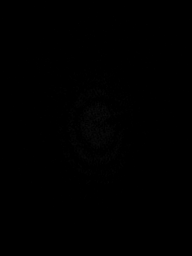

[Series 11: T2 · coronal · 5.0mm · 0.49mm/px · 3 of 27 slices shown (3 of 3)]
[im 1/27]
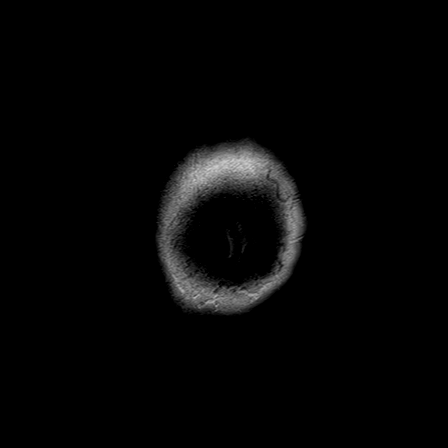
[im 14/27]
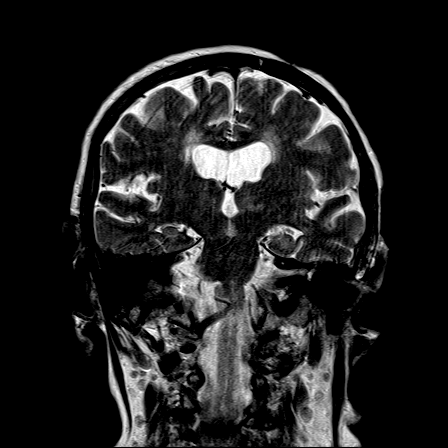
[im 27/27]
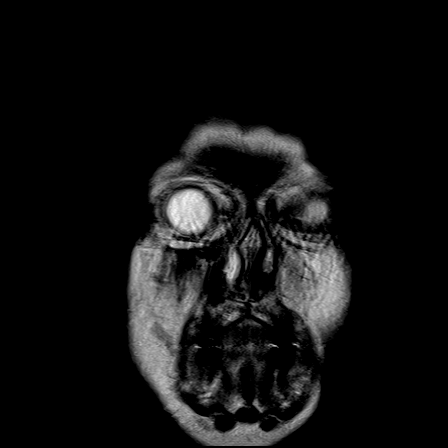

[Series 12: FLAIR · axial · 5.0mm · 0.45mm/px · z∈[-52,+104]mm · 3 of 25 slices shown]
[im 1/25]
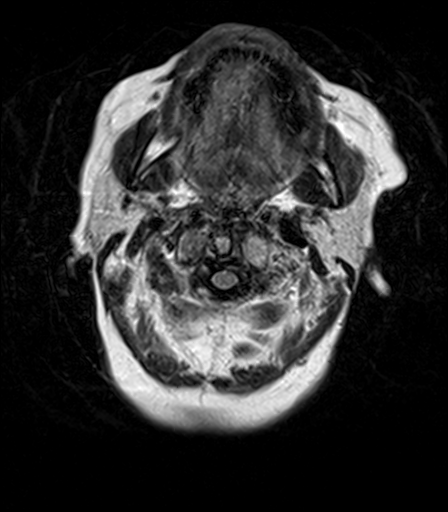
[im 13/25]
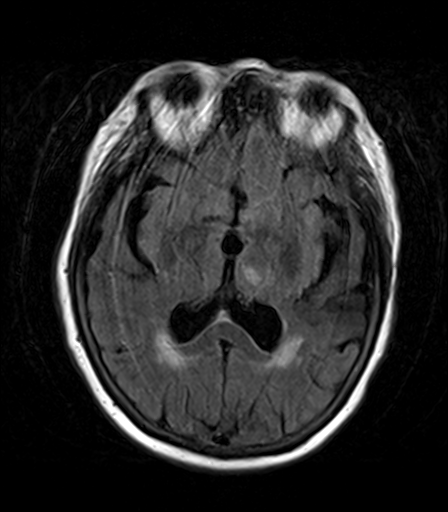
[im 25/25]
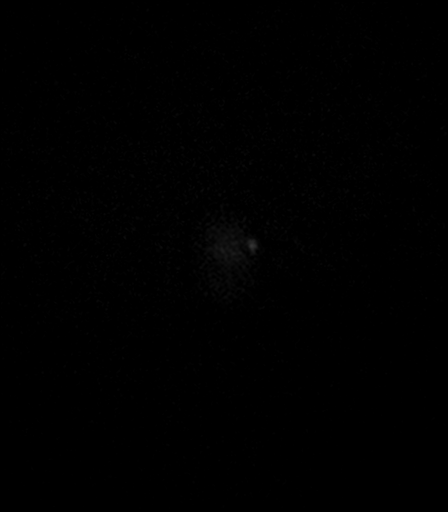

[Series 100: DWI · axial · 3.0mm · 1.80mm/px · z∈[-52,+106]mm · 6 of 50 slices shown (1 of 2)]
[im 1/50]
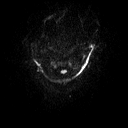
[im 10/50]
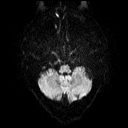
[im 20/50]
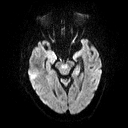
[im 30/50]
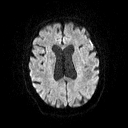
[im 40/50]
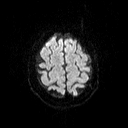
[im 50/50]
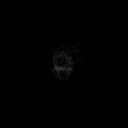

[Series 101: ADC · axial · 3.0mm · 1.80mm/px · z∈[-55,+106]mm · 7 of 55 slices shown (1 of 2)]
[im 1/55]
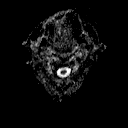
[im 10/55]
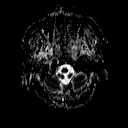
[im 19/55]
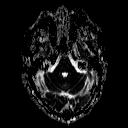
[im 28/55]
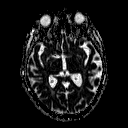
[im 37/55]
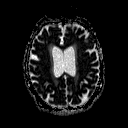
[im 46/55]
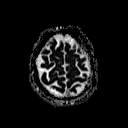
[im 55/55]
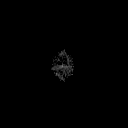

[Series 102: DWI · coronal · 3.0mm · 1.80mm/px · 6 of 45 slices shown (2 of 2)]
[im 1/45]
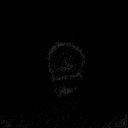
[im 9/45]
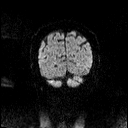
[im 18/45]
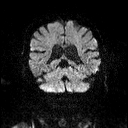
[im 27/45]
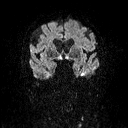
[im 36/45]
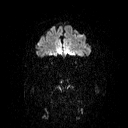
[im 45/45]
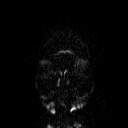

[Series 103: ADC · coronal · 3.0mm · 1.80mm/px · 6 of 45 slices shown (2 of 2)]
[im 1/45]
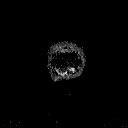
[im 9/45]
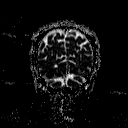
[im 18/45]
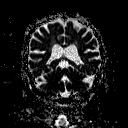
[im 27/45]
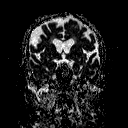
[im 36/45]
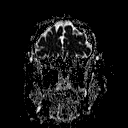
[im 45/45]
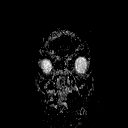

[48 of 48 positions shown; findings below may reference images not displayed]

FINDINGS: There is acute infarction within the left thalamus. No other acute
infarction. No evidence of mass effect or hemorrhage in that region.

There chronic small-vessel ischemic changes affecting the pons.
There are a few old small vessel cerebellar infarctions. There are
moderate changes of chronic small vessel disease affecting the
cerebral deep and subcortical white matter. No large vessel
territory infarction. There has been left frontal craniotomy. There
is some artifact related to that. No evidence of neoplastic mass
lesion, acute hemorrhage, hydrocephalus or extra-axial collection.
No pituitary mass. No inflammatory sinus disease. No skull or
skullbase lesion.
IMPRESSION: Acute infarction within the left thalamus. No mass effect or
hemorrhage.

Chronic small vessel ischemic changes elsewhere throughout the
brain.

## 2018-09-08 ENCOUNTER — Other Ambulatory Visit: Payer: Self-pay | Admitting: Internal Medicine

## 2018-09-08 DIAGNOSIS — N6320 Unspecified lump in the left breast, unspecified quadrant: Secondary | ICD-10-CM

## 2018-09-18 ENCOUNTER — Ambulatory Visit
Admission: RE | Admit: 2018-09-18 | Discharge: 2018-09-18 | Disposition: A | Discharge status: Home / Self Care | Payer: Medicare Other | Source: Ambulatory Visit | Attending: Internal Medicine | Admitting: Internal Medicine

## 2018-09-18 DIAGNOSIS — N6321 Unspecified lump in the left breast, upper outer quadrant: Secondary | ICD-10-CM | POA: Diagnosis not present

## 2018-09-18 DIAGNOSIS — N6320 Unspecified lump in the left breast, unspecified quadrant: Secondary | ICD-10-CM

## 2019-04-09 ENCOUNTER — Encounter: Payer: Self-pay | Admitting: Emergency Medicine

## 2019-04-09 ENCOUNTER — Observation Stay: Payer: Medicare Other

## 2019-04-09 ENCOUNTER — Observation Stay
Admission: EM | Admit: 2019-04-09 | Discharge: 2019-04-10 | Disposition: A | Discharge status: Home / Self Care | Payer: Medicare Other | Attending: Internal Medicine | Admitting: Internal Medicine

## 2019-04-09 ENCOUNTER — Other Ambulatory Visit: Payer: Self-pay

## 2019-04-09 DIAGNOSIS — I700 Atherosclerosis of aorta: Secondary | ICD-10-CM | POA: Insufficient documentation

## 2019-04-09 DIAGNOSIS — R001 Bradycardia, unspecified: Secondary | ICD-10-CM | POA: Diagnosis present

## 2019-04-09 DIAGNOSIS — I129 Hypertensive chronic kidney disease with stage 1 through stage 4 chronic kidney disease, or unspecified chronic kidney disease: Secondary | ICD-10-CM | POA: Insufficient documentation

## 2019-04-09 DIAGNOSIS — Z79899 Other long term (current) drug therapy: Secondary | ICD-10-CM | POA: Diagnosis not present

## 2019-04-09 DIAGNOSIS — N179 Acute kidney failure, unspecified: Secondary | ICD-10-CM

## 2019-04-09 DIAGNOSIS — I10 Essential (primary) hypertension: Secondary | ICD-10-CM

## 2019-04-09 DIAGNOSIS — Z87891 Personal history of nicotine dependence: Secondary | ICD-10-CM | POA: Diagnosis not present

## 2019-04-09 DIAGNOSIS — I447 Left bundle-branch block, unspecified: Secondary | ICD-10-CM | POA: Insufficient documentation

## 2019-04-09 DIAGNOSIS — R55 Syncope and collapse: Secondary | ICD-10-CM | POA: Diagnosis present

## 2019-04-09 DIAGNOSIS — Z8673 Personal history of transient ischemic attack (TIA), and cerebral infarction without residual deficits: Secondary | ICD-10-CM

## 2019-04-09 DIAGNOSIS — D649 Anemia, unspecified: Secondary | ICD-10-CM | POA: Diagnosis not present

## 2019-04-09 DIAGNOSIS — I083 Combined rheumatic disorders of mitral, aortic and tricuspid valves: Secondary | ICD-10-CM | POA: Diagnosis not present

## 2019-04-09 DIAGNOSIS — N189 Chronic kidney disease, unspecified: Secondary | ICD-10-CM | POA: Diagnosis not present

## 2019-04-09 DIAGNOSIS — Z20822 Contact with and (suspected) exposure to covid-19: Secondary | ICD-10-CM | POA: Insufficient documentation

## 2019-04-09 DIAGNOSIS — R42 Dizziness and giddiness: Secondary | ICD-10-CM

## 2019-04-09 DIAGNOSIS — Z7902 Long term (current) use of antithrombotics/antiplatelets: Secondary | ICD-10-CM | POA: Insufficient documentation

## 2019-04-09 DIAGNOSIS — E0590 Thyrotoxicosis, unspecified without thyrotoxic crisis or storm: Secondary | ICD-10-CM | POA: Diagnosis not present

## 2019-04-09 DIAGNOSIS — E039 Hypothyroidism, unspecified: Secondary | ICD-10-CM | POA: Insufficient documentation

## 2019-04-09 DIAGNOSIS — R9431 Abnormal electrocardiogram [ECG] [EKG]: Secondary | ICD-10-CM

## 2019-04-09 LAB — IRON AND TIBC
Iron: 92 ug/dL (ref 28–170)
Saturation Ratios: 24 % (ref 10.4–31.8)
TIBC: 389 ug/dL (ref 250–450)
UIBC: 297 ug/dL

## 2019-04-09 LAB — CBC
HCT: 34.9 % — ABNORMAL LOW (ref 36.0–46.0)
Hemoglobin: 11 g/dL — ABNORMAL LOW (ref 12.0–15.0)
MCH: 30.3 pg (ref 26.0–34.0)
MCHC: 31.5 g/dL (ref 30.0–36.0)
MCV: 96.1 fL (ref 80.0–100.0)
Platelets: 283 10*3/uL (ref 150–400)
RBC: 3.63 MIL/uL — ABNORMAL LOW (ref 3.87–5.11)
RDW: 14 % (ref 11.5–15.5)
WBC: 10 10*3/uL (ref 4.0–10.5)
nRBC: 0 % (ref 0.0–0.2)

## 2019-04-09 LAB — BASIC METABOLIC PANEL
Anion gap: 11 (ref 5–15)
BUN: 28 mg/dL — ABNORMAL HIGH (ref 8–23)
CO2: 25 mmol/L (ref 22–32)
Calcium: 9.3 mg/dL (ref 8.9–10.3)
Chloride: 103 mmol/L (ref 98–111)
Creatinine, Ser: 1.07 mg/dL — ABNORMAL HIGH (ref 0.44–1.00)
GFR calc Af Amer: 55 mL/min — ABNORMAL LOW (ref >60)
GFR calc non Af Amer: 47 mL/min — ABNORMAL LOW (ref >60)
Glucose, Bld: 117 mg/dL — ABNORMAL HIGH (ref 70–99)
Potassium: 4.5 mmol/L (ref 3.5–5.1)
Sodium: 139 mmol/L (ref 135–145)

## 2019-04-09 LAB — URINALYSIS, COMPLETE (UACMP) WITH MICROSCOPIC
Bilirubin Urine: NEGATIVE
Glucose, UA: NEGATIVE mg/dL
Hgb urine dipstick: NEGATIVE
Ketones, ur: NEGATIVE mg/dL
Leukocytes,Ua: NEGATIVE
Nitrite: NEGATIVE
Protein, ur: 30 mg/dL — AB
Specific Gravity, Urine: 1.023 (ref 1.005–1.030)
pH: 5 (ref 5.0–8.0)

## 2019-04-09 LAB — TROPONIN I (HIGH SENSITIVITY)
Troponin I (High Sensitivity): 7 ng/L (ref <18)
Troponin I (High Sensitivity): 9 ng/L (ref <18)

## 2019-04-09 LAB — TSH: TSH: 3.282 u[IU]/mL (ref 0.350–4.500)

## 2019-04-09 LAB — BRAIN NATRIURETIC PEPTIDE: B Natriuretic Peptide: 104 pg/mL — ABNORMAL HIGH (ref 0.0–100.0)

## 2019-04-09 MED ORDER — SODIUM CHLORIDE 0.9 % IV SOLN: INTRAVENOUS | Status: AC

## 2019-04-09 MED ORDER — SODIUM CHLORIDE 0.9% FLUSH: 3.0000 mL | Freq: Two times a day (BID) | INTRAVENOUS | Status: DC

## 2019-04-09 MED ORDER — SODIUM CHLORIDE 0.9% FLUSH
3.0000 mL | Freq: Once | INTRAVENOUS | Status: AC
  Administered 2019-04-09: 3 mL via INTRAVENOUS

## 2019-04-09 MED ORDER — ONDANSETRON HCL 4 MG/2ML IJ SOLN: 4.0000 mg | Freq: Four times a day (QID) | INTRAMUSCULAR | Status: DC | PRN

## 2019-04-09 MED ORDER — SENNOSIDES-DOCUSATE SODIUM 8.6-50 MG PO TABS: 1.0000 | ORAL_TABLET | Freq: Every evening | ORAL | Status: DC | PRN

## 2019-04-09 MED ORDER — ACETAMINOPHEN 325 MG PO TABS: 650.0000 mg | ORAL_TABLET | Freq: Four times a day (QID) | ORAL | Status: DC | PRN

## 2019-04-09 MED ORDER — ENOXAPARIN SODIUM 40 MG/0.4ML ~~LOC~~ SOLN
40.0000 mg | SUBCUTANEOUS | Status: DC
  Filled 2019-04-09: qty 0.4

## 2019-04-09 MED ORDER — ONDANSETRON HCL 4 MG PO TABS: 4.0000 mg | ORAL_TABLET | Freq: Four times a day (QID) | ORAL | Status: DC | PRN

## 2019-04-09 MED ORDER — ACETAMINOPHEN 650 MG RE SUPP: 650.0000 mg | Freq: Four times a day (QID) | RECTAL | Status: DC | PRN

## 2019-04-09 NOTE — ED Provider Notes (Signed)
[facility] Emergency Department Provider Note  ____________________________________________   I have reviewed the triage vital signs and the nursing notes.   HISTORY  Chief Complaint Near Syncope   History limited by: Not Limited   HPI Haley Rollins AGE is a 86 y.o. female who presents to the emergency department today because of concerns for a near syncopal episode.  The patient states she was in her kitchen making a blueberry cobbler when she started feeling like she was going to pass out.  She was able to get herself into the corner of the cabinets and slid down to the ground.  She states that for the past couple of weeks she has felt exceedingly weak.  She has had a similar near syncopal episodes a few times during this period.  Previously however she has been able to get herself to the couch.  Patient denies any sense of chest pain or palpitations when these episodes happen.  She denies any headache.   Records reviewed. Per medical record review patient has a history of HTN, TIA.   Past Medical History:  Diagnosis Date  . Hypertension   . TIA (transient ischemic attack)     Patient Active Problem List   Diagnosis Date Noted  . TIA (transient ischemic attack) 01/24/2015    Past Surgical History:  Procedure Laterality Date  . BRAIN SURGERY      Prior to Admission medications   Medication Sig Start Date End Date Taking? Authorizing Provider  atorvastatin (LIPITOR) 40 MG tablet Take 1 tablet (40 mg total) by mouth daily at 6 PM. 01/26/15   Sainani, Belia Heman, MD  Calcium Carbonate-Vitamin D (CALCIUM 600+D) 600-400 MG-UNIT tablet Take 1 tablet by mouth daily.     [provider]  citalopram (CELEXA) 20 MG tablet Take 20 mg by mouth daily.    [provider]  clopidogrel (PLAVIX) 75 MG tablet Take 1 tablet (75 mg total) by mouth daily. 01/26/15   Henreitta Leber, MD  irbesartan (AVAPRO) 300 MG tablet Take 300 mg by mouth daily.     [provider]  lisinopril (PRINIVIL,ZESTRIL) 5 MG tablet Take 1 tablet (5 mg total) by mouth daily. 01/26/15   Henreitta Leber, MD  meclizine (ANTIVERT) 25 MG tablet Take 1 tablet (25 mg total) by mouth 3 (three) times daily as needed for dizziness or nausea. 02/06/15   Daymon Larsen, MD  methimazole (TAPAZOLE) 5 MG tablet Take 2.5 mg by mouth daily.    [provider]  metoprolol succinate (TOPROL-XL) 25 MG 24 hr tablet Take 12.5 mg by mouth at bedtime.     [provider]  Multiple Vitamin (MULTIVITAMIN WITH MINERALS) TABS tablet Take 1 tablet by mouth daily.    [provider]  nitroGLYCERIN (NITROSTAT) 0.4 MG SL tablet Place 0.4 mg under the tongue every 5 (five) minutes as needed for chest pain.    [provider]    Allergies Sulfa antibiotics  Family History  Problem Relation Age of Onset  . Diabetes Neg Hx     Social History Social History   Tobacco Use  . Smoking status: Former Smoker    Types: Cigarettes  . Smokeless tobacco: Never Used  Substance Use Topics  . Alcohol use: Yes    Alcohol/week: 1.0 - 2.0 standard drinks    Types: 1 - 2 Glasses of wine per week    Comment: every other day  . Drug use: No    Review of  Systems Constitutional: No fever/chills Eyes: No visual changes. ENT: No sore throat. Cardiovascular: Denies chest pain. Respiratory: Denies shortness of breath. Gastrointestinal: No abdominal pain.  No nausea, no vomiting.  No diarrhea.   Genitourinary: Negative for dysuria. Musculoskeletal: Negative for back pain. Skin: Negative for rash. Neurological: Positive for lightheadedness. ____________________________________________   PHYSICAL EXAM:  VITAL SIGNS: ED Triage Vitals [04/09/19 1655]  Enc Vitals Group     BP (!) 122/55     Pulse Rate (!) 53     Resp 16     Temp 97.7 F (36.5 C)     Temp Source Oral     SpO2 100 %     Weight 164 lb (74.4 kg)     Height 5' 4.5" (1.638 m)     Head  Circumference      Peak Flow      Pain Score 4   Constitutional: Alert and oriented.  Eyes: Conjunctivae are normal.  ENT      Head: Normocephalic and atraumatic.      Nose: No congestion/rhinnorhea.      Mouth/Throat: Mucous membranes are moist.      Neck: No stridor. Hematological/Lymphatic/Immunilogical: No cervical lymphadenopathy. Cardiovascular: Normal rate, regular rhythm.  Positive for systolic murmur.  Respiratory: Normal respiratory effort without tachypnea nor retractions. Breath sounds are clear and equal bilaterally. No wheezes/rales/rhonchi. Gastrointestinal: Soft and non tender. No rebound. No guarding.  Genitourinary: Deferred Musculoskeletal: Normal range of motion in all extremities. No lower extremity edema. Neurologic:  Normal speech and language. No gross focal neurologic deficits are appreciated.  Skin:  Skin is warm, dry and intact. No rash noted. Psychiatric: Mood and affect are normal. Speech and behavior are normal. Patient exhibits appropriate insight and judgment.  ____________________________________________    LABS (pertinent positives/negatives)  BMP na 139, k 4.5, glu 117, cr 1.07 UA cloudy, 30 protein 0-5 rbc and wbc, rare bacteria, 6-10 squamous CBC wbc 10.0, hgb 11.0, plt 283  ____________________________________________   EKG  I, Nance Pear, attending physician, personally viewed and interpreted this EKG  EKG Time: 1651 Rate: 56 Rhythm: sinus bradycardia Axis: left axis deviation Intervals: qtc 497 QRS: IVCD ST changes: no st elevation Impression: abnormal ekg   ____________________________________________    RADIOLOGY  None ____________________________________________   PROCEDURES  Procedures  ____________________________________________   INITIAL IMPRESSION / ASSESSMENT AND PLAN / ED COURSE  Pertinent labs & imaging results that were available during my care of the patient were reviewed by me and considered in  my medical decision making (see chart for details).   Patient presented to the emergency department today after a near syncopal episode.  Initial work-up does not show significant anemia or electrolyte abnormality.  Troponin was negative.  EKG however does show changes.  New IVCD.  Additionally patient has a systolic murmur which she states she has never been told she has had in the past, although per chart review patient had murmur appreciated by PCP. I do think patient would benefit from admission to the hospital for further work up. Discussed findings and plan with patient.   ____________________________________________   FINAL CLINICAL IMPRESSION(S) / ED DIAGNOSES  Final diagnoses:  Near syncope  Abnormal EKG     Note: This dictation was prepared with Dragon dictation. Any transcriptional errors that result from this process are unintentional     Nance Pear, MD 04/09/19 2113

## 2019-04-09 NOTE — ED Notes (Signed)
Pt st this "syncope" has been happening aprox every 8-10days and she has no followed up on this event. Pt  Reports usually being able to make it to a chair before she faint but this time was unable to

## 2019-04-09 NOTE — ED Triage Notes (Signed)
Pt to ED via [facility] from home for near syncopal episode. Pt was in the kitchen cooking with her husband when she started to feel like she was going to pass out. Pt was able to slide herself down on the floor and put her head down. Pt states that she did not have LOC. VS were stable with EMS. When EMS was arriving pt started having some tightness in her chest. Pt is currently in NAD.   CBG 156

## 2019-04-09 NOTE — ED Notes (Signed)
Attempted to call and update pts daughter but no answer. Will attempt to re-dial at another time

## 2019-04-09 NOTE — ED Notes (Signed)
Daughters Sharyn Lull and Butch Penny were updated on pts condition and status regarding admission with pt's approval

## 2019-04-09 NOTE — ED Notes (Signed)
Diane Magda Paganini called to ck on pt. Explained that pt is waiting on exam room to be seen by Physician, pt's daughter reports she is going to come to pick pt up. RN encouraged daughter to let pt to be seen by MD, apologized for wait and explained as soon as a room is available pt will be the next to be seen

## 2019-04-10 ENCOUNTER — Observation Stay
Admit: 2019-04-10 | Discharge: 2019-04-10 | Disposition: A | Discharge status: Home / Self Care | Payer: Medicare Other | Attending: Internal Medicine | Admitting: Internal Medicine

## 2019-04-10 DIAGNOSIS — R42 Dizziness and giddiness: Secondary | ICD-10-CM

## 2019-04-10 DIAGNOSIS — R55 Syncope and collapse: Secondary | ICD-10-CM

## 2019-04-10 LAB — CBC
HCT: 31.2 % — ABNORMAL LOW (ref 36.0–46.0)
Hemoglobin: 10.1 g/dL — ABNORMAL LOW (ref 12.0–15.0)
MCH: 30.9 pg (ref 26.0–34.0)
MCHC: 32.4 g/dL (ref 30.0–36.0)
MCV: 95.4 fL (ref 80.0–100.0)
Platelets: 240 10*3/uL (ref 150–400)
RBC: 3.27 MIL/uL — ABNORMAL LOW (ref 3.87–5.11)
RDW: 14.3 % (ref 11.5–15.5)
WBC: 9 10*3/uL (ref 4.0–10.5)
nRBC: 0 % (ref 0.0–0.2)

## 2019-04-10 LAB — BASIC METABOLIC PANEL
Anion gap: 5 (ref 5–15)
BUN: 28 mg/dL — ABNORMAL HIGH (ref 8–23)
CO2: 24 mmol/L (ref 22–32)
Calcium: 8.5 mg/dL — ABNORMAL LOW (ref 8.9–10.3)
Chloride: 113 mmol/L — ABNORMAL HIGH (ref 98–111)
Creatinine, Ser: 0.75 mg/dL (ref 0.44–1.00)
GFR calc Af Amer: >60 mL/min (ref >60)
GFR calc non Af Amer: >60 mL/min (ref >60)
Glucose, Bld: 99 mg/dL (ref 70–99)
Potassium: 3.8 mmol/L (ref 3.5–5.1)
Sodium: 142 mmol/L (ref 135–145)

## 2019-04-10 LAB — VITAMIN B12: Vitamin B-12: 437 pg/mL (ref 180–914)

## 2019-04-10 LAB — ECHOCARDIOGRAM COMPLETE
Height: 64 in
Weight: 2680 oz

## 2019-04-10 LAB — SARS CORONAVIRUS 2 (TAT 6-24 HRS): SARS Coronavirus 2: NEGATIVE

## 2019-04-10 LAB — TROPONIN I (HIGH SENSITIVITY): Troponin I (High Sensitivity): 17 ng/L (ref <18)

## 2019-04-10 NOTE — Progress Notes (Signed)
Call received from [facility] who explained the troponin level was the high sensitivity test, and her results are WNL. Pt made aware.

## 2019-04-10 NOTE — Consult Note (Signed)
CARDIOLOGY CONSULT NOTE               Patient ID: Haley Rollins MRN: [id number] DOB/AGE: November 11, 1931 86 y.o.  Admit date: 04/09/2019 Referring Physician *Dr. Judd Gaudier hospitalist Primary Physician Dr. Ramonita Lab primary Primary Cardiologist Dr. Ubaldo Glassing Reason for Consultation vertigo lightheaded bradycardia possibly symptomatic  HPI: Patient is a 86 year old white female history of hypertension hypothyroidism TIA in the past has had half a dozen episodes of vertigo lightheadedness near syncope.  The last episode she had she was at the kitchen oven breaking apply and had acute lightheadedness dizziness and almost passed out but she was able lower soft on the floor she then was not able to get up she did not injure herself.  Denies any palpitations or tachycardia.  Denies any previous significant cardiac history.  Patient was admitted was found to be in bradycardia metoprolol was discontinued she feels reasonably well now denies any chest pain no shortness of breath now feels well enough to go home  Review of systems complete and found to be negative unless listed above     Past Medical History:  Diagnosis Date  . Hypertension   . TIA (transient ischemic attack)     Past Surgical History:  Procedure Laterality Date  . BRAIN SURGERY      Medications Prior to Admission  Medication Sig Dispense Refill Last Dose  . atorvastatin (LIPITOR) 40 MG tablet Take 1 tablet (40 mg total) by mouth daily at 6 PM. 60 tablet 1   . Calcium Carbonate-Vitamin D (CALCIUM 600+D) 600-400 MG-UNIT tablet Take 1 tablet by mouth daily.      . citalopram (CELEXA) 20 MG tablet Take 20 mg by mouth daily.     . clopidogrel (PLAVIX) 75 MG tablet Take 1 tablet (75 mg total) by mouth daily. 60 tablet 1   . irbesartan (AVAPRO) 300 MG tablet Take 300 mg by mouth daily.     Marland Kitchen lisinopril (PRINIVIL,ZESTRIL) 5 MG tablet Take 1 tablet (5 mg total) by mouth daily. 30 tablet 1   . meclizine (ANTIVERT) 25 MG tablet  Take 1 tablet (25 mg total) by mouth 3 (three) times daily as needed for dizziness or nausea. 15 tablet 0   . methimazole (TAPAZOLE) 5 MG tablet Take 2.5 mg by mouth daily.     . metoprolol succinate (TOPROL-XL) 25 MG 24 hr tablet Take 12.5 mg by mouth at bedtime.      . Multiple Vitamin (MULTIVITAMIN WITH MINERALS) TABS tablet Take 1 tablet by mouth daily.     . nitroGLYCERIN (NITROSTAT) 0.4 MG SL tablet Place 0.4 mg under the tongue every 5 (five) minutes as needed for chest pain.      Social History   Socioeconomic History  . Marital status: Married    Spouse name: Not on file  . Number of children: Not on file  . Years of education: Not on file  . Highest education level: Not on file  Occupational History  . Not on file  Tobacco Use  . Smoking status: Former Smoker    Types: Cigarettes  . Smokeless tobacco: Never Used  Substance and Sexual Activity  . Alcohol use: Yes    Alcohol/week: 1.0 - 2.0 standard drinks    Types: 1 - 2 Glasses of wine per week    Comment: every other day  . Drug use: No  . Sexual activity: Never  Other Topics Concern  . Not on file  Social History Narrative  .  Not on file   Social Determinants of Health   Financial Resource Strain:   . Difficulty of Paying Living Expenses: Not on file  Food Insecurity:   . Worried About Charity fundraiser in the Last Year: Not on file  . Ran Out of Food in the Last Year: Not on file  Transportation Needs:   . Lack of Transportation (Medical): Not on file  . Lack of Transportation (Non-Medical): Not on file  Physical Activity:   . Days of Exercise per Week: Not on file  . Minutes of Exercise per Session: Not on file  Stress:   . Feeling of Stress : Not on file  Social Connections:   . Frequency of Communication with Friends and Family: Not on file  . Frequency of Social Gatherings with Friends and Family: Not on file  . Attends Religious Services: Not on file  . Active Member of Clubs or Organizations:  Not on file  . Attends Archivist Meetings: Not on file  . Marital Status: Not on file  Intimate Partner Violence:   . Fear of Current or Ex-Partner: Not on file  . Emotionally Abused: Not on file  . Physically Abused: Not on file  . Sexually Abused: Not on file    Family History  Problem Relation Age of Onset  . Diabetes Neg Hx       Review of systems complete and found to be negative unless listed above      PHYSICAL EXAM  General: Well developed, well nourished, in no acute distress HEENT:  Normocephalic and atramatic Neck:  No JVD.  Lungs: Clear bilaterally to auscultation and percussion. Heart: HRRR . Normal S1 and S2 without gallops or murmurs.  Abdomen: Bowel sounds are positive, abdomen soft and non-tender  Msk:  Back normal, normal gait. Normal strength and tone for age. Extremities: No clubbing, cyanosis or edema.   Neuro: Alert and oriented X 3. Psych:  Good affect, responds appropriately  Labs:   Lab Results  Component Value Date   WBC 9.0 04/10/2019   HGB 10.1 (L) 04/10/2019   HCT 31.2 (L) 04/10/2019   MCV 95.4 04/10/2019   PLT 240 04/10/2019    Recent Labs  Lab 04/10/19 0307  NA 142  K 3.8  CL 113*  CO2 24  BUN 28*  CREATININE 0.75  CALCIUM 8.5*  GLUCOSE 99   Lab Results  Component Value Date   TROPONINI <0.03 02/06/2015    Lab Results  Component Value Date   CHOL 135 01/25/2015   Lab Results  Component Value Date   HDL 52 01/25/2015   Lab Results  Component Value Date   LDLCALC 70 01/25/2015   Lab Results  Component Value Date   TRIG 63 01/25/2015   Lab Results  Component Value Date   CHOLHDL 2.6 01/25/2015   No results found for: LDLDIRECT    Radiology: CT HEAD WO CONTRAST  Result Date: 04/09/2019 CLINICAL DATA:  86 year old female with neurologic deficit. EXAM: CT HEAD WITHOUT CONTRAST TECHNIQUE: Contiguous axial images were obtained from the base of the skull through the vertex without intravenous contrast.  COMPARISON:  Head CT dated 10/14/2015. FINDINGS: Brain: Mild to moderate age-related atrophy and chronic microvascular ischemic changes. Small old left thalamic lacunar infarct. There is no acute intracranial hemorrhage. No mass effect or midline shift. No extra-axial fluid collection. Vascular: No hyperdense vessel or unexpected calcification. Skull: No acute calvarial pathology.  Left frontal craniotomy. Sinuses/Orbits: No acute finding. Other:  None IMPRESSION: 1. No acute intracranial pathology. 2. Age-related atrophy and chronic microvascular ischemic changes. Electronically Signed   By: Anner Crete M.D.   On: 04/09/2019 21:50   Portable Chest 1 View  Result Date: 04/09/2019 CLINICAL DATA:  86 year old female status post syncope while cooking. EXAM: PORTABLE CHEST 1 VIEW COMPARISON:  Portable chest 10/25/2007. FINDINGS: Portable AP upright view at 2138 hours. Mild cardiomegaly. Probable chronic gastric hiatal hernia. Mild tortuosity of the thoracic aorta. Calcified arch atherosclerosis. Other mediastinal contours are within normal limits. Visualized tracheal air column is within normal limits. Allowing for portable technique the lungs are clear. Negative visible bowel gas pattern. No acute osseous abnormality identified. IMPRESSION: 1. No acute cardiopulmonary abnormality. 2. Mild cardiomegaly and chronic hiatal hernia suspected. 3.  Aortic Atherosclerosis (ICD10-I70.0). Electronically Signed   By: Genevie Ann M.D.   On: 04/09/2019 22:01    EKG: Sinus bradycardia incomplete left bundle branch block nonspecific ST-T wave changes  ASSESSMENT AND PLAN:  Postural dizziness presyncope Bradycardia Acute on chronic renal insufficiency History of TIA Hypertension Interventricular conduction delay bundle branch block abnormal EKG . Plan Agree with evaluation for rule out myocardial infarction as well as EKGs Follow-up troponins Recommend discontinue beta-blocker because of bradycardia Recommend  hypertension management and control Recommend mild hydration for renal insufficiency No clinicals for permanent pacemaker at this point Recommend Holter monitor for 48 to 72 hours Have the patient follow-up with cardiology as an outpatient Patient should be safe to go home and follow-up as an outpatient  Signed: Yolonda Kida MD 04/10/2019, 11:13 AM

## 2019-04-10 NOTE — Progress Notes (Signed)
*  PRELIMINARY RESULTS* Echocardiogram 2D Echocardiogram has been performed.  Haley Rollins 04/10/2019, 8:27 AM

## 2019-04-10 NOTE — Progress Notes (Signed)
Discharge instructions explained to pt and pts daughter/ verbalized an understanding / iv and tele removed/ transported off unit via wheelchair.  

## 2019-04-10 NOTE — H&P (Signed)
History and Physical    Haley Rollins [id number] DOB: [birth year] DOA: 04/09/2019  PCP: Adin Hector, MD   Patient coming from: home  I have personally briefly reviewed patient's old medical records in [place]  Chief Complaint: lightheadedness, near syncope  HPI: Haley Rollins is a 86 y.o. female with medical history significant for Haley Rollins is a 86 y.o. female with medical history significant for hypertension, hypothyroidism and TIA, who presented to the emergency room following an episode in which she felt lightheaded and like she was about to pass out while she was preparing a dish in the kitchen, she was able to ease herself onto the ground and did not fall.  She denies chest pain, shortness of breath, palpitations, headache visual disturbance, numbness tingling or weakness on one side of the face or extremities that preceded the episode..  She states that she has had several similar episodes in the past few weeks but they were not as severe was always able to somewhat sit down and then it would go away.   She denies shortness of breath, nausea vomiting or, diaphoresis as well.   Additionally she denies any recent illness and no fever or chills  ED Course: On arrival in the emergency room she was afebrile with blood pressure 122/55, heart rate 48-53, RR 15 with O2 sat 98% on room air.  On her blood work she had a creatinine of 1.07 which is up from her baseline of 0.5 to and she was slightly anemic with hemoglobin of 11, though this appears to be her baseline.  Urinalysis was unremarkable.  EKG showed sinus bradycardia at 56.  Hospitalist consulted for admission chest x-ray and head CT ordered following admission.  Review of Systems: As per HPI otherwise 10 point review of systems negative.    Past Medical History:  Diagnosis Date  . Hypertension   . TIA (transient ischemic attack)     Past Surgical History:  Procedure Laterality Date  . BRAIN SURGERY         reports that she has quit smoking. Her smoking use included cigarettes. She has never used smokeless tobacco. She reports current alcohol use of about 1.0 - 2.0 standard drinks of alcohol per week. She reports that she does not use drugs.  Allergies  Allergen Reactions  . Sulfa Antibiotics Hives    Family History  Problem Relation Age of Onset  . Diabetes Neg Hx      Prior to Admission medications   Medication Sig Start Date End Date Taking? Authorizing Provider  atorvastatin (LIPITOR) 40 MG tablet Take 1 tablet (40 mg total) by mouth daily at 6 PM. 01/26/15   Sainani, Belia Heman, MD  Calcium Carbonate-Vitamin D (CALCIUM 600+D) 600-400 MG-UNIT tablet Take 1 tablet by mouth daily.     [provider]  citalopram (CELEXA) 20 MG tablet Take 20 mg by mouth daily.    [provider]  clopidogrel (PLAVIX) 75 MG tablet Take 1 tablet (75 mg total) by mouth daily. 01/26/15   Henreitta Leber, MD  irbesartan (AVAPRO) 300 MG tablet Take 300 mg by mouth daily.    [provider]  lisinopril (PRINIVIL,ZESTRIL) 5 MG tablet Take 1 tablet (5 mg total) by mouth daily. 01/26/15   Henreitta Leber, MD  meclizine (ANTIVERT) 25 MG tablet Take 1 tablet (25 mg total) by mouth 3 (three) times daily as needed for dizziness or nausea. 02/06/15   Daymon Larsen, MD  methimazole (TAPAZOLE) 5 MG tablet Take 2.5 mg by mouth daily.    [provider]  metoprolol succinate (TOPROL-XL) 25 MG 24 hr tablet Take 12.5 mg by mouth at bedtime.     [provider]  Multiple Vitamin (MULTIVITAMIN WITH MINERALS) TABS tablet Take 1 tablet by mouth daily.    [provider]  nitroGLYCERIN (NITROSTAT) 0.4 MG SL tablet Place 0.4 mg under the tongue every 5 (five) minutes as needed for chest pain.    [provider]    Physical Exam: Vitals:   04/09/19 2000 04/09/19 2030 04/09/19 2130 04/09/19 2243  BP: (!) 155/60 (!) 143/59 (!) 142/70 (!) 163/69  Pulse: (!) 48 (!)  51 (!) 50 62  Resp: 15 14 18    Temp:    (!) 97.4 F (36.3 C)  TempSrc:    Oral  SpO2: 93% 100% 98% 100%  Weight:    74.8 kg  Height:    5\' 4"  (1.626 m)     Vitals:   04/09/19 2000 04/09/19 2030 04/09/19 2130 04/09/19 2243  BP: (!) 155/60 (!) 143/59 (!) 142/70 (!) 163/69  Pulse: (!) 48 (!) 51 (!) 50 62  Resp: 15 14 18    Temp:    (!) 97.4 F (36.3 C)  TempSrc:    Oral  SpO2: 93% 100% 98% 100%  Weight:    74.8 kg  Height:    5\' 4"  (1.626 m)    Constitutional: NAD, alert and oriented x 3 Eyes: PERRL, lids and conjunctivae normal ENMT: Mucous membranes are moist.  Neck: normal, supple, no masses, no thyromegaly Respiratory: clear to auscultation bilaterally, no wheezing, no crackles. Normal respiratory effort. No accessory muscle use.  Cardiovascular: Regular rate and rhythm,Grade 2 murmurleft sternal border / rubs / gallops. No extremity edema. 2+ pedal pulses. No carotid bruits.  Abdomen: no tenderness, no masses palpated. No hepatosplenomegaly. Bowel sounds positive.  Musculoskeletal: no clubbing / cyanosis. No joint deformity upper and lower extremities.  Skin: no rashes, lesions, ulcers.  Neurologic: No gross focal neurologic deficit. Psychiatric: Normal mood and affect.   Labs on Admission: I have personally reviewed following labs and imaging studies  CBC: Recent Labs  Lab 04/09/19 1703  WBC 10.0  HGB 11.0*  HCT 34.9*  MCV 96.1  PLT [id number]   Basic Metabolic Panel: Recent Labs  Lab 04/09/19 1703  NA 139  K 4.5  CL 103  CO2 25  GLUCOSE 117*  BUN 28*  CREATININE 1.07*  CALCIUM 9.3   GFR: Estimated Creatinine Clearance: 38 mL/min (A) (by C-G formula based on SCr of 1.07 mg/dL (H)). Liver Function Tests: No results for input(s): AST, ALT, ALKPHOS, BILITOT, PROT, ALBUMIN in the last 168 hours. No results for input(s): LIPASE, AMYLASE in the last 168 hours. No results for input(s): AMMONIA in the last 168 hours. Coagulation Profile: No results for  input(s): INR, PROTIME in the last 168 hours. Cardiac Enzymes: No results for input(s): CKTOTAL, CKMB, CKMBINDEX, TROPONINI in the last 168 hours. BNP (last 3 results) No results for input(s): PROBNP in the last 8760 hours. HbA1C: No results for input(s): HGBA1C in the last 72 hours. CBG: No results for input(s): GLUCAP in the last 168 hours. Lipid Profile: No results for input(s): CHOL, HDL, LDLCALC, TRIG, CHOLHDL, LDLDIRECT in the last 72 hours. Thyroid Function Tests: Recent Labs    04/09/19 1703  TSH 3.282   Anemia Panel: Recent Labs    04/09/19 2218  TIBC 389  IRON 92  Urine analysis:    Component Value Date/Time   COLORURINE AMBER (A) 04/09/2019 1703   APPEARANCEUR CLOUDY (A) 04/09/2019 1703   LABSPEC 1.023 04/09/2019 1703   PHURINE 5.0 04/09/2019 1703   GLUCOSEU NEGATIVE 04/09/2019 1703   HGBUR NEGATIVE 04/09/2019 1703   BILIRUBINUR NEGATIVE 04/09/2019 1703   KETONESUR NEGATIVE 04/09/2019 1703   PROTEINUR 30 (A) 04/09/2019 1703   NITRITE NEGATIVE 04/09/2019 1703   LEUKOCYTESUR NEGATIVE 04/09/2019 1703    Radiological Exams on Admission: CT HEAD WO CONTRAST  Result Date: 04/09/2019 CLINICAL DATA:  86 year old female with neurologic deficit. EXAM: CT HEAD WITHOUT CONTRAST TECHNIQUE: Contiguous axial images were obtained from the base of the skull through the vertex without intravenous contrast. COMPARISON:  Head CT dated 10/14/2015. FINDINGS: Brain: Mild to moderate age-related atrophy and chronic microvascular ischemic changes. Small old left thalamic lacunar infarct. There is no acute intracranial hemorrhage. No mass effect or midline shift. No extra-axial fluid collection. Vascular: No hyperdense vessel or unexpected calcification. Skull: No acute calvarial pathology.  Left frontal craniotomy. Sinuses/Orbits: No acute finding. Other: None IMPRESSION: 1. No acute intracranial pathology. 2. Age-related atrophy and chronic microvascular ischemic changes.  Electronically Signed   By: Anner Crete M.D.   On: 04/09/2019 21:50   Portable Chest 1 View  Result Date: 04/09/2019 CLINICAL DATA:  86 year old female status post syncope while cooking. EXAM: PORTABLE CHEST 1 VIEW COMPARISON:  Portable chest 10/25/2007. FINDINGS: Portable AP upright view at 2138 hours. Mild cardiomegaly. Probable chronic gastric hiatal hernia. Mild tortuosity of the thoracic aorta. Calcified arch atherosclerosis. Other mediastinal contours are within normal limits. Visualized tracheal air column is within normal limits. Allowing for portable technique the lungs are clear. Negative visible bowel gas pattern. No acute osseous abnormality identified. IMPRESSION: 1. No acute cardiopulmonary abnormality. 2. Mild cardiomegaly and chronic hiatal hernia suspected. 3.  Aortic Atherosclerosis (ICD10-I70.0). Electronically Signed   By: Genevie Ann M.D.   On: 04/09/2019 22:01    EKG: Independently reviewed.   Assessment/Plan Principal Problem:   Postural dizziness with presyncope Active Problems:   AKI (acute kidney injury) (HCC)   Sinus bradycardia   History of TIA (transient ischemic attack)   Essential hypertension   Hyperthyroidism   Symptomatic bradycardia   Postural dizziness with presyncope   Symptomatic bradycardia   History of TIA (transient ischemic attack) -Patient presented with intermittent presyncopal episodes of uncertain etiology -Differential includes orthostatic hypotension, possible TIA, possible symptomatic bradycardia, heart rate 48-53 -Hold metoprolol -Syncope work-up to include echocardiogram, CT head in view of history of TIA, continuous cardiac monitoring.  Follow TSH -Continue to cycle troponins to evaluate for ACS -IV hydration given AKI possible dehydration  Active Problems:   AKI (acute kidney injury) ([place]) -Creatinine 1.07, last baseline was 0.52 -IV hydration monitor renal function      Essential hypertension -Holding metoprolol.  Holding all  antihypertensives as well due to BP 122/55.  Can resume as blood pressure tolerate    Hyperthyroidism -Continue methimazole -Follow TSH  Anemia -Follow stool for occult blood, TSH, iron and vitamin B12      DVT prophylaxis: lovenox  Code Status: dull code  Family Communication: none  Disposition Plan: Back to previous home environment Consults called: none     Athena Masse MD [facility]     04/10/2019, 2:01 AM

## 2019-04-10 NOTE — Discharge Summary (Signed)
Haley Rollins DOB: 03-23-1931 DOA: 04/09/2019  PCP: Adin Hector, MD  Admit date: 04/09/2019 Discharge date: 04/10/2019  Admitted From: Home Disposition: Home  Recommendations for Outpatient Follow-up:  1. Follow up with PCP in 4 days 2. Please obtain BMP/CBC in one week 3. Please follow up on the following pending results: None 4. Follow-up cardiology Dr. Ubaldo Glassing in 4 days for evaluation of cardiac meds and Holter monitor  Home Health: None needed   Discharge Condition:Stable CODE STATUS: Full Diet recommendation: Heart Healthy Brief/Interim Summary: Per HPI: Haley Rollins is a 86 y.o. female with medical history significant for Haley Rollins a 86 y.o.femalewith medical history significant forhypertension, hypothyroidism and TIA, who presented to the emergency room following an episode in which she felt lightheaded and like she was about to pass out while she was preparing a dish in the kitchen, she was able to ease herself onto the ground and did not fall.  She denies chest pain, shortness of breath, palpitations, headache visual disturbance, numbness tingling or weakness on one side of the face or extremities that preceded the episode.. She states that she has had several similar episodes in the past few weeks but they were not as severe was always able to somewhat sit down and then it would go away.  In the ER she was found with heart rate between 48-53.  Blood work was otherwise unremarkable except slight anemia with hemoglobin of 11 which appears to be her baseline.  EKG showed sinus bradycardia at 56.  She had a CT of the head that did not reveal any acute intracranial pathology.  She was admitted overnight and on telemetry she was sinus bradycardia at 56 at rest and heart rate increased to 90 with ambulation.  Is on beta-blockers at home which was discontinued when she was admitted.  Allergy was consulted.  A Holter monitor was placed with follow-up with  cardiology as outpatient.  They felt patient was safe to go home with follow-up as outpatient.  An echocardiogram was also obtained which did not reveal any significant valvular disease and normal EF.  Her home meds stated she was taking lisinopril and losartan which losartan was discontinued and she will be discharged home with only lisinopril.  Her metoprolol was discontinued too.  She was also negative for orthostatics.  She is stable to be discharged   Discharge Diagnoses:  Principal Problem:   Postural dizziness with presyncope Active Problems:   Sinus bradycardia   History of TIA (transient ischemic attack)   Essential hypertension   Hyperthyroidism   Symptomatic bradycardia   AKI (acute kidney injury) [facility])    Discharge Instructions  Discharge Instructions    Call MD for:  difficulty breathing, headache or visual disturbances   Complete by: As directed    Call MD for:  persistant nausea and vomiting   Complete by: As directed    Diet - low sodium heart healthy   Complete by: As directed    Discharge instructions   Complete by: As directed    Follow-up with Dr. Ubaldo Glassing next week to discuss the monitor and cardiac meds Follow-up with primary care next week   Increase activity slowly   Complete by: As directed      Allergies as of 04/10/2019      Reactions   Sulfa Antibiotics Hives      Medication List    STOP taking these medications   irbesartan 300 MG tablet Commonly known as: AVAPRO  metoprolol succinate 25 MG 24 hr tablet Commonly known as: TOPROL-XL     TAKE these medications   atorvastatin 40 MG tablet Commonly known as: LIPITOR Take 1 tablet (40 mg total) by mouth daily at 6 PM.   Calcium 600+D 600-400 MG-UNIT tablet Generic drug: Calcium Carbonate-Vitamin D Take 1 tablet by mouth daily.   citalopram 20 MG tablet Commonly known as: CELEXA Take 20 mg by mouth daily.   clopidogrel 75 MG tablet Commonly known as: PLAVIX Take 1 tablet (75 mg total)  by mouth daily.   lisinopril 5 MG tablet Commonly known as: Zestril Take 1 tablet (5 mg total) by mouth daily.   meclizine 25 MG tablet Commonly known as: ANTIVERT Take 1 tablet (25 mg total) by mouth 3 (three) times daily as needed for dizziness or nausea.   methimazole 5 MG tablet Commonly known as: TAPAZOLE Take 2.5 mg by mouth daily.   multivitamin with minerals Tabs tablet Take 1 tablet by mouth daily.   nitroGLYCERIN 0.4 MG SL tablet Commonly known as: NITROSTAT Place 0.4 mg under the tongue every 5 (five) minutes as needed for chest pain.      Follow-up Information    Teodoro Spray, MD Follow up in 4 day(s).   Specialty: Cardiology Contact information: [place] [address] [phone]        Tama High III, MD Follow up in 4 day(s).   Specialty: Internal Medicine Why: discuss blood pressure meds Contact information: [address] [facility] [place] [address] [phone]          Allergies  Allergen Reactions  . Sulfa Antibiotics Hives    Consultations:  Cardiology   Procedures/Studies: CT HEAD WO CONTRAST  Result Date: 04/09/2019 CLINICAL DATA:  86 year old female with neurologic deficit. EXAM: CT HEAD WITHOUT CONTRAST TECHNIQUE: Contiguous axial images were obtained from the base of the skull through the vertex without intravenous contrast. COMPARISON:  Head CT dated 10/14/2015. FINDINGS: Brain: Mild to moderate age-related atrophy and chronic microvascular ischemic changes. Small old left thalamic lacunar infarct. There is no acute intracranial hemorrhage. No mass effect or midline shift. No extra-axial fluid collection. Vascular: No hyperdense vessel or unexpected calcification. Skull: No acute calvarial pathology.  Left frontal craniotomy. Sinuses/Orbits: No acute finding. Other: None IMPRESSION: 1. No acute intracranial pathology. 2. Age-related atrophy and chronic microvascular ischemic changes.  Electronically Signed   By: Anner Crete M.D.   On: 04/09/2019 21:50   Portable Chest 1 View  Result Date: 04/09/2019 CLINICAL DATA:  86 year old female status post syncope while cooking. EXAM: PORTABLE CHEST 1 VIEW COMPARISON:  Portable chest 10/25/2007. FINDINGS: Portable AP upright view at 2138 hours. Mild cardiomegaly. Probable chronic gastric hiatal hernia. Mild tortuosity of the thoracic aorta. Calcified arch atherosclerosis. Other mediastinal contours are within normal limits. Visualized tracheal air column is within normal limits. Allowing for portable technique the lungs are clear. Negative visible bowel gas pattern. No acute osseous abnormality identified. IMPRESSION: 1. No acute cardiopulmonary abnormality. 2. Mild cardiomegaly and chronic hiatal hernia suspected. 3.  Aortic Atherosclerosis (ICD10-I70.0). Electronically Signed   By: Genevie Ann M.D.   On: 04/09/2019 22:01   ECHOCARDIOGRAM COMPLETE  Result Date: 04/10/2019    ECHOCARDIOGRAM REPORT   Patient Name:   Haley Rollins [facility] Date of Exam: 04/10/2019 Medical Rec #:  [id number]        Height:       64.0 in Accession #:    [id number]  Weight:       167.5 lb Date of Birth:  1931-02-25        BSA:          1.814 m Patient Age:    86 years         BP:           133/56 mmHg Patient Gender: F                HR:           51 bpm. Exam Location:  [facility] Procedure: 2D Echo Indications:     Syncope 780.2/ R55  History:         Patient has prior history of Echocardiogram examinations, most                  recent 01/25/2015.  Sonographer:     Arville Go RDCS Referring Phys:  [id number] Athena Masse Diagnosing Phys: Yolonda Kida MD  Sonographer Comments: Technically difficult study due to poor echo windows. IMPRESSIONS  1. Left ventricular ejection fraction, by estimation, is 55 to 60%. The left ventricle has normal function. The left ventricle has no regional wall motion abnormalities. Left ventricular diastolic parameters are consistent with  Grade I diastolic dysfunction (impaired relaxation).  2. Right ventricular systolic function is normal. The right ventricular size is normal.  3. The mitral valve is grossly normal. Mild mitral valve regurgitation.  4. The aortic valve is grossly normal. Aortic valve regurgitation is trivial. Mild aortic valve sclerosis is present, with no evidence of aortic valve stenosis. FINDINGS  Left Ventricle: Left ventricular ejection fraction, by estimation, is 55 to 60%. The left ventricle has normal function. The left ventricle has no regional wall motion abnormalities. The left ventricular internal cavity size was normal in size. There is  no left ventricular hypertrophy. Left ventricular diastolic parameters are consistent with Grade I diastolic dysfunction (impaired relaxation). Right Ventricle: The right ventricular size is normal. No increase in right ventricular wall thickness. Right ventricular systolic function is normal. Left Atrium: Left atrial size was normal in size. Right Atrium: Right atrial size was normal in size. Pericardium: There is no evidence of pericardial effusion. Mitral Valve: The mitral valve is grossly normal. Mild mitral valve regurgitation. Tricuspid Valve: The tricuspid valve is grossly normal. Tricuspid valve regurgitation is mild. Aortic Valve: The aortic valve is grossly normal. Aortic valve regurgitation is trivial. Aortic regurgitation PHT measures 683 msec. Mild aortic valve sclerosis is present, with no evidence of aortic valve stenosis. Aortic valve mean gradient measures 11.5 mmHg. Aortic valve peak gradient measures 22.8 mmHg. Aortic valve area, by VTI measures 0.76 cm. Pulmonic Valve: The pulmonic valve was normal in structure. Pulmonic valve regurgitation is not visualized. Aorta: The aortic root is normal in size and structure. IAS/Shunts: No atrial level shunt detected by color flow Doppler.  LEFT VENTRICLE PLAX 2D LVIDd:         4.61 cm      Diastology LVIDs:         3.40 cm       LV e' lateral:   4.90 cm/s LV PW:         1.43 cm      LV E/e' lateral: 17.9 LV IVS:        1.15 cm      LV e' medial:    5.98 cm/s LVOT diam:     1.60 cm      LV E/e' medial:  14.6  LV SV:         49.06 ml LV SV Index:   27.04 LVOT Area:     2.01 cm  LV Volumes (MOD) LV vol d, MOD A2C: 109.0 ml LV vol d, MOD A4C: 83.7 ml LV vol s, MOD A2C: 39.9 ml LV vol s, MOD A4C: 35.1 ml LV SV MOD A2C:     69.1 ml LV SV MOD A4C:     83.7 ml LV SV MOD BP:      58.8 ml RIGHT VENTRICLE RV Basal diam:  3.16 cm RV S prime:     13.90 cm/s TAPSE (M-mode): 2.2 cm LEFT ATRIUM             Index       RIGHT ATRIUM           Index LA diam:        3.80 cm 2.09 cm/m  RA Area:     13.10 cm LA Vol (A2C):   43.1 ml 23.76 ml/m RA Volume:   34.00 ml  18.74 ml/m LA Vol (A4C):   88.3 ml 48.67 ml/m LA Biplane Vol: 64.4 ml 35.49 ml/m  AORTIC VALVE                    PULMONIC VALVE AV Area (Vmax):    0.82 cm     PV Vmax:       1.74 m/s AV Area (Vmean):   0.82 cm     PV Peak grad:  12.1 mmHg AV Area (VTI):     0.76 cm AV Vmax:           239.00 cm/s AV Vmean:          153.000 cm/s AV VTI:            0.647 m AV Peak Grad:      22.8 mmHg AV Mean Grad:      11.5 mmHg LVOT Vmax:         97.50 cm/s LVOT Vmean:        62.600 cm/s LVOT VTI:          0.244 m LVOT/AV VTI ratio: 0.38 AI PHT:            683 msec  AORTA Ao Root diam: 3.00 cm Ao Asc diam:  3.80 cm MITRAL VALVE                TRICUSPID VALVE MV Area (PHT): 1.81 cm     TV Peak grad:   25.3 mmHg MV Decel Time: 419 msec     TV Vmax:        2.51 m/s MV E velocity: 87.50 cm/s MV A velocity: 123.00 cm/s  SHUNTS MV E/A ratio:  0.71         Systemic VTI:  0.24 m                             Systemic Diam: 1.60 cm Dwayne Prince Rome MD Electronically signed by Yolonda Kida MD Signature Date/Time: 04/10/2019/3:08:55 PM    Final        Subjective: Patient without any complaints this AM.  Denies any dizziness or lightheadedness.  No other symptoms.  Discharge Exam: Vitals:   04/10/19 0800  04/10/19 1346  BP: (!) 126/56 (!) 117/53  Pulse: (!) 50 (!) 56  Resp: 18 18  Temp:  98.2 F (36.8 C)  SpO2: 98% 96%  Vitals:   04/10/19 0352 04/10/19 0437 04/10/19 0800 04/10/19 1346  BP: (!) 133/56  (!) 126/56 (!) 117/53  Pulse: (!) 53  (!) 50 (!) 56  Resp:   18 18  Temp: (!) 97.5 F (36.4 C)   98.2 F (36.8 C)  TempSrc: Oral     SpO2: 99%  98% 96%  Weight:  76 kg    Height:        General: Pt is alert, awake, not in acute distress Cardiovascular: RRR, S1/S2 +, no rubs, no gallops Respiratory: CTA bilaterally, no wheezing, no rhonchi Abdominal: Soft, NT, ND, bowel sounds + Extremities: no edema, no cyanosis    The results of significant diagnostics from this hospitalization (including imaging, microbiology, ancillary and laboratory) are listed below for reference.     Microbiology: Recent Results (from the past 240 hour(s))  SARS CORONAVIRUS 2 (TAT 6-24 HRS) Nasopharyngeal Nasopharyngeal Swab     Status: None   Collection Time: 04/09/19  9:01 PM   Specimen: Nasopharyngeal Swab  Result Value Ref Range Status   SARS Coronavirus 2 NEGATIVE NEGATIVE Final    Comment: (NOTE) SARS-CoV-2 target nucleic acids are NOT DETECTED. The SARS-CoV-2 RNA is generally detectable in upper and lower respiratory specimens during the acute phase of infection. Negative results do not preclude SARS-CoV-2 infection, do not rule out co-infections with other pathogens, and should not be used as the sole basis for treatment or other patient management decisions. Negative results must be combined with clinical observations, patient history, and epidemiological information. The expected result is Negative. Fact Sheet for Patients: SugarRoll.be Fact Sheet for Healthcare Providers: https://www.woods-mathews.com/ This test is not yet approved or cleared by the [place] FDA and  has been authorized for detection and/or diagnosis of SARS-CoV-2  by FDA under an Emergency Use Authorization (EUA). This EUA will remain  in effect (meaning this test can be used) for the duration of the COVID-19 declaration under Section 56 4(b)(1) of the Act, 21 U.S.C. section 360bbb-3(b)(1), unless the authorization is terminated or revoked sooner. Performed at [facility], [place] [address], [place], [place] [address]      Labs: BNP (last 3 results) Recent Labs    04/09/19 1703  BNP [id number]*   Basic Metabolic Panel: Recent Labs  Lab 04/09/19 1703 04/10/19 0307  NA 139 142  K 4.5 3.8  CL 103 113*  CO2 25 24  GLUCOSE 117* 99  BUN 28* 28*  CREATININE 1.07* 0.75  CALCIUM 9.3 8.5*   Liver Function Tests: No results for input(s): AST, ALT, ALKPHOS, BILITOT, PROT, ALBUMIN in the last 168 hours. No results for input(s): LIPASE, AMYLASE in the last 168 hours. No results for input(s): AMMONIA in the last 168 hours. CBC: Recent Labs  Lab 04/09/19 1703 04/10/19 0307  WBC 10.0 9.0  HGB 11.0* 10.1*  HCT 34.9* 31.2*  MCV 96.1 95.4  PLT 283 240   Cardiac Enzymes: No results for input(s): CKTOTAL, CKMB, CKMBINDEX, TROPONINI in the last 168 hours. BNP: Invalid input(s): POCBNP CBG: No results for input(s): GLUCAP in the last 168 hours. D-Dimer No results for input(s): DDIMER in the last 72 hours. Hgb A1c No results for input(s): HGBA1C in the last 72 hours. Lipid Profile No results for input(s): CHOL, HDL, LDLCALC, TRIG, CHOLHDL, LDLDIRECT in the last 72 hours. Thyroid function studies Recent Labs    04/09/19 1703  TSH 3.282   Anemia work up Recent Labs    04/09/19 2218 04/10/19 0011  VITAMINB12  --  437  TIBC 389  --   IRON 92  --    Urinalysis    Component Value Date/Time   COLORURINE AMBER (A) 04/09/2019 1703   APPEARANCEUR CLOUDY (A) 04/09/2019 1703   LABSPEC 1.023 04/09/2019 1703   PHURINE 5.0 04/09/2019 1703   GLUCOSEU NEGATIVE 04/09/2019 1703   HGBUR NEGATIVE 04/09/2019 1703   BILIRUBINUR NEGATIVE  04/09/2019 1703   KETONESUR NEGATIVE 04/09/2019 1703   PROTEINUR 30 (A) 04/09/2019 1703   NITRITE NEGATIVE 04/09/2019 1703   LEUKOCYTESUR NEGATIVE 04/09/2019 1703   Sepsis Labs Invalid input(s): PROCALCITONIN,  WBC,  LACTICIDVEN Microbiology Recent Results (from the past 240 hour(s))  SARS CORONAVIRUS 2 (TAT 6-24 HRS) Nasopharyngeal Nasopharyngeal Swab     Status: None   Collection Time: 04/09/19  9:01 PM   Specimen: Nasopharyngeal Swab  Result Value Ref Range Status   SARS Coronavirus 2 NEGATIVE NEGATIVE Final    Comment: (NOTE) SARS-CoV-2 target nucleic acids are NOT DETECTED. The SARS-CoV-2 RNA is generally detectable in upper and lower respiratory specimens during the acute phase of infection. Negative results do not preclude SARS-CoV-2 infection, do not rule out co-infections with other pathogens, and should not be used as the sole basis for treatment or other patient management decisions. Negative results must be combined with clinical observations, patient history, and epidemiological information. The expected result is Negative. Fact Sheet for Patients: SugarRoll.be Fact Sheet for Healthcare Providers: https://www.woods-mathews.com/ This test is not yet approved or cleared by the [place] FDA and  has been authorized for detection and/or diagnosis of SARS-CoV-2 by FDA under an Emergency Use Authorization (EUA). This EUA will remain  in effect (meaning this test can be used) for the duration of the COVID-19 declaration under Section 56 4(b)(1) of the Act, 21 U.S.C. section 360bbb-3(b)(1), unless the authorization is terminated or revoked sooner. Performed at [facility], [place] [address] [place], [place] [address]      Time coordinating discharge: Over 30 minutes  SIGNED:   Nolberto Hanlon, MD  [facility] 04/10/2019, 4:52 PM Pager   If 7PM-7AM, please contact night-coverage [email or web address] Password [email or web address]

## 2019-04-10 NOTE — Progress Notes (Signed)
Pt's troponin is increased from last level drawn, though pt remains asymptomatic.  Rufina Falco, NP notified. Discussed trending troponins, no new orders received yet. Will CTM.

## 2019-04-10 NOTE — Progress Notes (Signed)
Pt ambulated around nursing station with RN/ tolerated well/ no c/o dizziness/ max HR 91   48hr holter monitor applied by respiratory as orderedt

## 2019-04-10 NOTE — TOC Transition Note (Signed)
Transition of Care [facility]) - CM/SW Discharge Note   Patient Details  Name: Haley Rollins MRN: [id number] Date of Birth: 1931-05-12  [facility] CM/SW Contact:  Truitt Merle, LCSW Phone Number: 04/10/2019, 5:06 PM   Clinical Narrative:    LCSW received verbal consult to speak with patient and daughter re: home health. Patient presented for near syncopal episode. Patient to discharge today. Met with patient and daughter at bedside, at length. Patient from home and lives with husband. Patient able to perform activities of daily living independently but sometimes "passes out." Patient receives help from daughters and son-in-laws, as needed and they also have someone to clean the home on a regular basis. Discussed custodial care services, advanced directive planning and possibilities for next levels of care, if needed. No further TOC needs assessed.    Final next level of care: Home/Self Care Barriers to Discharge: No Barriers Identified   Patient Goals and CMS Choice        Discharge Placement  Home with husband                     Discharge Plan and Services  Custodial care to be obtained by family, as decided. Will discuss further as a family post discharge.                                    Social Determinants of Health (SDOH) Interventions     Readmission Risk Interventions No flowsheet data found.

## 2019-04-10 NOTE — Progress Notes (Deleted)
Rufina Falco NP made aware of increasing troponin. NP to order trop trends. Pt asymptomatic at this time. Will CTM.

## 2019-10-01 ENCOUNTER — Other Ambulatory Visit: Payer: Self-pay | Admitting: Internal Medicine

## 2019-10-01 DIAGNOSIS — Z1231 Encounter for screening mammogram for malignant neoplasm of breast: Secondary | ICD-10-CM

## 2019-10-19 ENCOUNTER — Encounter (INDEPENDENT_AMBULATORY_CARE_PROVIDER_SITE_OTHER): Payer: Self-pay

## 2019-10-19 ENCOUNTER — Other Ambulatory Visit: Payer: Self-pay

## 2019-10-19 ENCOUNTER — Ambulatory Visit
Admission: RE | Admit: 2019-10-19 | Discharge: 2019-10-19 | Disposition: A | Discharge status: Home / Self Care | Payer: Medicare Other | Source: Ambulatory Visit | Attending: Internal Medicine | Admitting: Internal Medicine

## 2019-10-19 DIAGNOSIS — Z1231 Encounter for screening mammogram for malignant neoplasm of breast: Secondary | ICD-10-CM | POA: Diagnosis not present

## 2019-12-28 ENCOUNTER — Other Ambulatory Visit: Payer: Self-pay | Admitting: Physician Assistant

## 2019-12-28 DIAGNOSIS — H9311 Tinnitus, right ear: Secondary | ICD-10-CM

## 2020-01-09 ENCOUNTER — Other Ambulatory Visit: Payer: Self-pay

## 2020-01-09 ENCOUNTER — Ambulatory Visit
Admission: RE | Admit: 2020-01-09 | Discharge: 2020-01-09 | Disposition: A | Discharge status: Home / Self Care | Payer: Medicare Other | Source: Ambulatory Visit | Attending: Physician Assistant | Admitting: Physician Assistant

## 2020-01-09 DIAGNOSIS — H9311 Tinnitus, right ear: Secondary | ICD-10-CM | POA: Insufficient documentation

## 2020-01-09 MED ORDER — GADOBUTROL 1 MMOL/ML IV SOLN
7.5000 mL | Freq: Once | INTRAVENOUS | Status: AC | PRN
  Administered 2020-01-09: 7.5 mL via INTRAVENOUS

## 2020-03-02 ENCOUNTER — Telehealth: Payer: Self-pay | Admitting: Physician Assistant

## 2020-03-02 ENCOUNTER — Encounter: Payer: Self-pay | Admitting: Physician Assistant

## 2020-03-02 ENCOUNTER — Other Ambulatory Visit: Payer: Self-pay | Admitting: Physician Assistant

## 2020-03-02 DIAGNOSIS — G459 Transient cerebral ischemic attack, unspecified: Secondary | ICD-10-CM

## 2020-03-02 DIAGNOSIS — C9110 Chronic lymphocytic leukemia of B-cell type not having achieved remission: Secondary | ICD-10-CM

## 2020-03-02 DIAGNOSIS — U071 COVID-19: Secondary | ICD-10-CM

## 2020-03-02 DIAGNOSIS — I10 Essential (primary) hypertension: Secondary | ICD-10-CM

## 2020-03-02 HISTORY — DX: Chronic lymphocytic leukemia of B-cell type not having achieved remission: C91.10

## 2020-03-02 MED ORDER — MOLNUPIRAVIR EUA 200MG CAPSULE: 4.0000 | ORAL_CAPSULE | Freq: Two times a day (BID) | ORAL | 0 refills | Status: AC

## 2020-03-02 NOTE — Telephone Encounter (Signed)
Outpatient Oral COVID Treatment Note  I connected with Haley Rollins on 03/02/2020/9:26 AM by telephone and verified that I am speaking with the correct person using two identifiers.  I discussed the limitations, risks, security, and privacy concerns of performing an evaluation and management service by telephone and the availability of in person appointments. I also discussed with the patient that there may be a patient responsible charge related to this service. The patient expressed understanding and agreed to proceed.  Patient location: home Provider location: office  Diagnosis: COVID-19 infection  Purpose of visit: Discussion of potential use of Molnupiravir or Paxlovid, a new treatment for mild to moderate COVID-19 viral infection in non-hospitalized patients.   Subjective: Patient is a 87 y.o. female who has been diagnosed with COVID 19 viral infection.  Their symptoms began on 1/10 with cough, body aches, chills.     Past Medical History:  Diagnosis Date  . CLL (chronic lymphocytic leukemia) ([place]) 03/02/2020  . Hypertension   . TIA (transient ischemic attack)     Allergies  Allergen Reactions  . Sulfa Antibiotics Hives     Current Outpatient Medications:  .  atorvastatin (LIPITOR) 40 MG tablet, Take 1 tablet (40 mg total) by mouth daily at 6 PM., Disp: 60 tablet, Rfl: 1 .  Calcium Carbonate-Vitamin D (CALCIUM 600+D) 600-400 MG-UNIT tablet, Take 1 tablet by mouth daily. , Disp: , Rfl:  .  citalopram (CELEXA) 20 MG tablet, Take 20 mg by mouth daily., Disp: , Rfl:  .  clopidogrel (PLAVIX) 75 MG tablet, Take 1 tablet (75 mg total) by mouth daily., Disp: 60 tablet, Rfl: 1 .  lisinopril (PRINIVIL,ZESTRIL) 5 MG tablet, Take 1 tablet (5 mg total) by mouth daily., Disp: 30 tablet, Rfl: 1 .  meclizine (ANTIVERT) 25 MG tablet, Take 1 tablet (25 mg total) by mouth 3 (three) times daily as needed for dizziness or nausea., Disp: 15 tablet, Rfl: 0 .  methimazole (TAPAZOLE) 5 MG tablet,  Take 2.5 mg by mouth daily., Disp: , Rfl:  .  molnupiravir EUA 200 mg CAPS, Take 4 capsules (800 mg total) by mouth 2 (two) times daily for 5 days., Disp: 40 capsule, Rfl: 0 .  Multiple Vitamin (MULTIVITAMIN WITH MINERALS) TABS tablet, Take 1 tablet by mouth daily., Disp: , Rfl:  .  nitroGLYCERIN (NITROSTAT) 0.4 MG SL tablet, Place 0.4 mg under the tongue every 5 (five) minutes as needed for chest pain., Disp: , Rfl:   Objective: did not see pt and spoke to dgtr Laboratory Data:  No results found for this or any previous visit (from the past 2160 hour(s)).   Assessment: 87 y.o. female with mild/moderate COVID 19 viral infection diagnosed on 1/13 at high risk for progression to severe COVID 19.  Plan:  This patient is a 87 y.o. female that meets the following criteria for Emergency Use Authorization of: Molnupiravir  1. Age >18 yr 2. SARS-COV-2 positive test 3. Symptom onset < 5 days 4. Mild-to-moderate COVID disease with high risk for severe progression to hospitalization or death   I have spoken and communicated the following to the patient or parent/caregiver regarding: 1. Molnupiravir is an unapproved drug that is authorized for use under an [employer].  2. There are no adequate, approved, available products for the treatment of COVID-19 in adults who have mild-to-moderate COVID-19 and are at high risk for progressing to severe COVID-19, including hospitalization or death. 3. Other therapeutics are currently authorized. For additional information on all products authorized  for treatment or prevention of COVID-19, please see [employer].  4. There are benefits and risks of taking this treatment as outlined in the "Fact Sheet for Patients and Caregivers."  5. "Fact Sheet for Patients and Caregivers" was reviewed with patient. A hard copy will be provided to patient  from pharmacy prior to the patient receiving treatment. 6. Patients should continue to self-isolate and use infection control measures (e.g., wear mask, isolate, social distance, avoid sharing personal items, clean and disinfect "high touch" surfaces, and frequent handwashing) according to CDC guidelines.  7. The patient or parent/caregiver has the option to accept or refuse treatment. 8. [place] has established a pregnancy surveillance program. 9. Females of childbearing potential should use a reliable method of contraception correctly and consistently, as applicable, for the duration of treatment and for 4 days after the last dose of Molnupiravir. 7. Males of reproductive potential who are sexually active with females of childbearing potential should use a reliable method of contraception correctly and consistently during treatment and for at least 3 months after the last dose. 11. Pregnancy status and risk was assessed. Patient verbalized understanding of precautions.   After reviewing above information with the patient, the patient agrees to receive molnupiravir.  Follow up instructions:    . Take prescription BID x 5 days as directed . Reach out to pharmacist for counseling on medication if desired . For concerns regarding further COVID symptoms please follow up with your PCP or urgent care . For urgent or life-threatening issues, seek care at your local emergency department  The patient was provided an opportunity to ask questions, and all were answered. The patient agreed with the plan and demonstrated an understanding of the instructions.   Script sent to [facility] and opted to pick up RX.  The patient was advised to call their PCP or seek an in-person evaluation if the symptoms worsen or if the condition fails to improve as anticipated.   I provided 20 minutes of non face-to-face video visit time during this encounter, and > 50% was spent counseling  as documented under my assessment & plan.  [name], PA-C 03/02/2020 /9:26 AM

## 2020-03-02 NOTE — Progress Notes (Signed)
I connected by phone with Haley Rollins on 03/02/2020 at 9:21 AM to discuss the potential use of a new treatment for mild to moderate COVID-19 viral infection in non-hospitalized patients.  This patient is a 87 y.o. female that meets the FDA criteria for Emergency Use Authorization of COVID monoclonal antibody sotrovimab.  Has a (+) direct SARS-CoV-2 viral test result  Has mild or moderate COVID-19   Is NOT hospitalized due to COVID-19  Is within 10 days of symptom onset  Has at least one of the high risk factor(s) for progression to severe COVID-19 and/or hospitalization as defined in EUA.  Specific high risk criteria : Older age (>/= 85 yo), BMI > 25, Immunosuppressive Disease or Treatment, Cardiovascular disease or hypertension and Other high risk medical condition per CDC:  high SVI, unvaccinated   I have spoken and communicated the following to the patient or parent/caregiver regarding COVID monoclonal antibody treatment:  1. FDA has authorized the emergency use for the treatment of mild to moderate COVID-19 in adults and pediatric patients with positive results of direct SARS-CoV-2 viral testing who are 27 years of age and older weighing at least 40 kg, and who are at high risk for progressing to severe COVID-19 and/or hospitalization.  2. The significant known and potential risks and benefits of COVID monoclonal antibody, and the extent to which such potential risks and benefits are unknown.  3. Information on available alternative treatments and the risks and benefits of those alternatives, including clinical trials.  4. Patients treated with COVID monoclonal antibody should continue to self-isolate and use infection control measures (e.g., wear mask, isolate, social distance, avoid sharing personal items, clean and disinfect "high touch" surfaces, and frequent handwashing) according to CDC guidelines.   5. The patient or parent/caregiver has the option to accept or refuse COVID  monoclonal antibody treatment.  After reviewing this information with the patient, the patient has agreed to receive one of the available covid 19 monoclonal antibodies and will be provided an appropriate fact sheet prior to infusion.   Sx onset 1/10. Unvaccinated. Set up for sotrovimab and molnuipivar. Will bring in copy of + test.    [name], PA-C 03/02/2020 9:21 AM

## 2020-04-22 ENCOUNTER — Emergency Department
Admission: EM | Admit: 2020-04-22 | Discharge: 2020-04-22 | Disposition: A | Discharge status: Home / Self Care | Payer: Medicare Other | Attending: Emergency Medicine | Admitting: Emergency Medicine

## 2020-04-22 ENCOUNTER — Emergency Department: Payer: Medicare Other

## 2020-04-22 ENCOUNTER — Other Ambulatory Visit: Payer: Self-pay

## 2020-04-22 DIAGNOSIS — Z7902 Long term (current) use of antithrombotics/antiplatelets: Secondary | ICD-10-CM | POA: Diagnosis not present

## 2020-04-22 DIAGNOSIS — Z87891 Personal history of nicotine dependence: Secondary | ICD-10-CM | POA: Diagnosis not present

## 2020-04-22 DIAGNOSIS — Z8673 Personal history of transient ischemic attack (TIA), and cerebral infarction without residual deficits: Secondary | ICD-10-CM | POA: Diagnosis not present

## 2020-04-22 DIAGNOSIS — M25512 Pain in left shoulder: Secondary | ICD-10-CM | POA: Insufficient documentation

## 2020-04-22 DIAGNOSIS — Z79899 Other long term (current) drug therapy: Secondary | ICD-10-CM | POA: Insufficient documentation

## 2020-04-22 DIAGNOSIS — I10 Essential (primary) hypertension: Secondary | ICD-10-CM | POA: Diagnosis not present

## 2020-04-22 DIAGNOSIS — R0789 Other chest pain: Secondary | ICD-10-CM | POA: Diagnosis not present

## 2020-04-22 LAB — COMPREHENSIVE METABOLIC PANEL
ALT: 14 U/L (ref 0–44)
AST: 34 U/L (ref 15–41)
Albumin: 3.3 g/dL — ABNORMAL LOW (ref 3.5–5.0)
Alkaline Phosphatase: 64 U/L (ref 38–126)
Anion gap: 7 (ref 5–15)
BUN: 20 mg/dL (ref 8–23)
CO2: 24 mmol/L (ref 22–32)
Calcium: 8.4 mg/dL — ABNORMAL LOW (ref 8.9–10.3)
Chloride: 108 mmol/L (ref 98–111)
Creatinine, Ser: 0.58 mg/dL (ref 0.44–1.00)
GFR, Estimated: >60 mL/min (ref >60)
Glucose, Bld: 105 mg/dL — ABNORMAL HIGH (ref 70–99)
Potassium: 4.2 mmol/L (ref 3.5–5.1)
Sodium: 139 mmol/L (ref 135–145)
Total Bilirubin: 1.1 mg/dL (ref 0.3–1.2)
Total Protein: 5.7 g/dL — ABNORMAL LOW (ref 6.5–8.1)

## 2020-04-22 LAB — CBC
HCT: 32.9 % — ABNORMAL LOW (ref 36.0–46.0)
Hemoglobin: 10.4 g/dL — ABNORMAL LOW (ref 12.0–15.0)
MCH: 30.5 pg (ref 26.0–34.0)
MCHC: 31.6 g/dL (ref 30.0–36.0)
MCV: 96.5 fL (ref 80.0–100.0)
Platelets: 223 10*3/uL (ref 150–400)
RBC: 3.41 MIL/uL — ABNORMAL LOW (ref 3.87–5.11)
RDW: 15.3 % (ref 11.5–15.5)
WBC: 7.3 10*3/uL (ref 4.0–10.5)
nRBC: 0 % (ref 0.0–0.2)

## 2020-04-22 LAB — TROPONIN I (HIGH SENSITIVITY)
Troponin I (High Sensitivity): 7 ng/L (ref <18)
Troponin I (High Sensitivity): 7 ng/L (ref <18)

## 2020-04-22 NOTE — ED Notes (Signed)
Pt presents to ED via EMS with c/o of chest pain. Pt states chest pain radiates to L shoulder. Pt states talking 3 nitro's PTA and also a full dose of 324mg  of ASA PTA. EMS placed 22G IV to R AC PTA. Pt states she is no longer have chest pain but states pain to L shoulder is a 2/10 at this time. Pt denies injury to trauma to this L shoulder. Pt is A&OX4. Pt denies fevers or chills. Pt denies N/V/D.

## 2020-04-22 NOTE — ED Notes (Signed)
D/C, f/up, and reasons to return to ED discussed with pt, pt verbalized understanding.NAD noted. Pt denies chest pain on D/C.

## 2020-04-22 NOTE — ED Provider Notes (Signed)
[facility] Emergency Department Provider Note   ____________________________________________    I have reviewed the triage vital signs and the nursing notes.   HISTORY  Chief Complaint Chest Pain     HPI Haley Rollins is a 87 y.o. female with history of CLL, hypertension, CAD who presents with complaints of chest pain.  Patient reports she woke up this morning, felt well, took her dogs out, had coffee and then developed pressure-like chest pain.  She took 2 nitroglycerin tablets with 5 minutes in between and started to feel better, took her third 1 as well.  EMS arrived and she continued complaint of some mild pain in her left shoulder although chest pain had resolved so decided to come to the emergency department.  She does see Dr. Ubaldo Glassing of cardiology, no diaphoresis, no cough, no shortness of breath, no pleurisy, no tearing pain.  Past Medical History:  Diagnosis Date  . CLL (chronic lymphocytic leukemia) ([place]) 03/02/2020  . Hypertension   . TIA (transient ischemic attack)     Patient Active Problem List   Diagnosis Date Noted  . CLL (chronic lymphocytic leukemia) ([place]) 03/02/2020  . Postural dizziness with presyncope 04/09/2019  . Sinus bradycardia 04/09/2019  . History of TIA (transient ischemic attack) 04/09/2019  . Essential hypertension 04/09/2019  . Hyperthyroidism 04/09/2019  . Symptomatic bradycardia 04/09/2019  . AKI (acute kidney injury) ([place]) 04/09/2019  . TIA (transient ischemic attack) 01/24/2015    Past Surgical History:  Procedure Laterality Date  . BRAIN SURGERY      Prior to Admission medications   Medication Sig Start Date End Date Taking? Authorizing Provider  atorvastatin (LIPITOR) 40 MG tablet Take 1 tablet (40 mg total) by mouth daily at 6 PM. 01/26/15   Sainani, Belia Heman, MD  Calcium Carbonate-Vitamin D (CALCIUM 600+D) 600-400 MG-UNIT tablet Take 1 tablet by mouth daily.     [provider]  citalopram  (CELEXA) 20 MG tablet Take 20 mg by mouth daily.    [provider]  clopidogrel (PLAVIX) 75 MG tablet Take 1 tablet (75 mg total) by mouth daily. 01/26/15   Henreitta Leber, MD  lisinopril (PRINIVIL,ZESTRIL) 5 MG tablet Take 1 tablet (5 mg total) by mouth daily. 01/26/15   Henreitta Leber, MD  meclizine (ANTIVERT) 25 MG tablet Take 1 tablet (25 mg total) by mouth 3 (three) times daily as needed for dizziness or nausea. 02/06/15   Daymon Larsen, MD  methimazole (TAPAZOLE) 5 MG tablet Take 2.5 mg by mouth daily.    [provider]  Multiple Vitamin (MULTIVITAMIN WITH MINERALS) TABS tablet Take 1 tablet by mouth daily.    [provider]  nitroGLYCERIN (NITROSTAT) 0.4 MG SL tablet Place 0.4 mg under the tongue every 5 (five) minutes as needed for chest pain.    [provider]     Allergies Sulfa antibiotics  Family History  Problem Relation Age of Onset  . Diabetes Neg Hx   . Breast cancer Neg Hx     Social History Social History   Tobacco Use  . Smoking status: Former Smoker    Types: Cigarettes  . Smokeless tobacco: Never Used  Substance Use Topics  . Alcohol use: Yes    Alcohol/week: 1.0 - 2.0 standard drink    Types: 1 - 2 Glasses of wine per week    Comment: every other day  . Drug use: No    Review of Systems  Constitutional: No fever/chills  Eyes: No visual changes.  ENT: No sore throat. Cardiovascular: As above Respiratory: Denies shortness of breath. Gastrointestinal: No abdominal pain.  No nausea, no vomiting.   Genitourinary: Negative for dysuria. Musculoskeletal: Negative for back pain. Skin: Negative for rash. Neurological: Negative for headaches or weakness   ____________________________________________   PHYSICAL EXAM:  VITAL SIGNS: ED Triage Vitals  Enc Vitals Group     BP 04/22/20 1141 (!) 148/81     Pulse Rate 04/22/20 1141 (!) 53     Resp 04/22/20 1141 18     Temp 04/22/20 1141 97.8 F (36.6 C)      Temp Source 04/22/20 1141 Oral     SpO2 04/22/20 1141 100 %     Weight 04/22/20 1142 72.6 kg (160 lb)     Height 04/22/20 1142 1.651 m (5\' 5" )     Head Circumference --      Peak Flow --      Pain Score 04/22/20 1142 2     Pain Loc --      Pain Edu? --      Excl. in [place]? --     Constitutional: Alert and oriented.    Nose: No congestion/rhinnorhea. Mouth/Throat: Mucous membranes are moist.    Cardiovascular: Normal rate, regular rhythm. Kermit Balo peripheral circulation. Respiratory: Normal respiratory effort.  No retractions. Lungs CTAB. Gastrointestinal: Soft and nontender. No distention.  No CVA tenderness.  Musculoskeletal: No lower extremity tenderness nor edema.  Warm and well perfused Neurologic:  Normal speech and language. No gross focal neurologic deficits are appreciated.  Skin:  Skin is warm, dry and intact. No rash noted. Psychiatric: Mood and affect are normal. Speech and behavior are normal.  ____________________________________________   LABS (all labs ordered are listed, but only abnormal results are displayed)  Labs Reviewed  CBC - Abnormal; Notable for the following components:      Result Value   RBC 3.41 (*)    Hemoglobin 10.4 (*)    HCT 32.9 (*)    All other components within normal limits  COMPREHENSIVE METABOLIC PANEL - Abnormal; Notable for the following components:   Glucose, Bld 105 (*)    Calcium 8.4 (*)    Total Protein 5.7 (*)    Albumin 3.3 (*)    All other components within normal limits  TROPONIN I (HIGH SENSITIVITY)  TROPONIN I (HIGH SENSITIVITY)   ____________________________________________  EKG  ED ECG REPORT I, Lavonia Drafts, the attending physician, personally viewed and interpreted this ECG.  Date: 04/22/2020  Rhythm: normal sinus rhythm QRS Axis: normal Intervals: normal ST/T Wave abnormalities: normal Narrative Interpretation: no evidence of acute  ischemia  ____________________________________________  RADIOLOGY  Chest x-ray reviewed by me, unremarkable ____________________________________________   PROCEDURES  Procedure(s) performed: No  Procedures   Critical Care performed: No ____________________________________________   INITIAL IMPRESSION / ASSESSMENT AND PLAN / ED COURSE  Pertinent labs & imaging results that were available during my care of the patient were reviewed by me and considered in my medical decision making (see chart for details).  Patient presents with chest pain as described above, well-appearing now and essentially asymptomatic.  EKG is reassuring.  Vital signs unremarkable.  No shortness of breath  No pleasant cardiac monitor, obtain labs, chest x-ray  High sensitivity troponin is normal, pending delta, patient remains asymptomatic ----------------------------------------- 2:48 PM on 04/22/2020 -----------------------------------------  2nd troponin is normal, patient is feeling well and is anxious to leave.  She knows to return if any recurrence of her  chest pain, she will follow-up with her cardiologist Dr. Ubaldo Glassing.  She and daughter agree with plan    ____________________________________________   FINAL CLINICAL IMPRESSION(S) / ED DIAGNOSES  Final diagnoses:  Atypical chest pain        Note:  This document was prepared using Dragon voice recognition software and may include unintentional dictation errors.   Lavonia Drafts, MD 04/22/20 [phone]

## 2020-09-28 ENCOUNTER — Encounter: Payer: Self-pay | Admitting: Dermatology

## 2020-09-28 ENCOUNTER — Other Ambulatory Visit: Payer: Self-pay

## 2020-09-28 ENCOUNTER — Ambulatory Visit (INDEPENDENT_AMBULATORY_CARE_PROVIDER_SITE_OTHER): Payer: Medicare Other | Admitting: Dermatology

## 2020-09-28 DIAGNOSIS — B078 Other viral warts: Secondary | ICD-10-CM

## 2020-09-28 DIAGNOSIS — D485 Neoplasm of uncertain behavior of skin: Secondary | ICD-10-CM | POA: Diagnosis not present

## 2020-09-28 DIAGNOSIS — L918 Other hypertrophic disorders of the skin: Secondary | ICD-10-CM | POA: Diagnosis not present

## 2020-09-28 NOTE — Patient Instructions (Addendum)
Wound Care Instructions  Cleanse wound gently with soap and water once a day then pat dry with clean gauze. Apply a thing coat of Petrolatum (petroleum jelly, "Vaseline") over the wound (unless you have an allergy to this). We recommend that you use a new, sterile tube of Vaseline. Do not pick or remove scabs. Do not remove the yellow or white "healing tissue" from the base of the wound.  Cover the wound with fresh, clean, nonstick gauze and secure with paper tape. You may use Band-Aids in place of gauze and tape if the would is small enough, but would recommend trimming much of the tape off as there is often too much. Sometimes Band-Aids can irritate the skin.  You should call the office for your biopsy report after 1 week if you have not already been contacted.  If you experience any problems, such as abnormal amounts of bleeding, swelling, significant bruising, significant pain, or evidence of infection, please call the office immediately.  FOR ADULT SURGERY PATIENTS: If you need something for pain relief you may take 1 extra strength Tylenol (acetaminophen) AND 2 Ibuprofen (200mg each) together every 4 hours as needed for pain. (do not take these if you are allergic to them or if you have a reason you should not take them.) Typically, you may only need pain medication for 1 to 3 days.   If you have any questions or concerns for your doctor, please call our main line at [phone] and press option 4 to reach your doctor's medical assistant. If no one answers, please leave a voicemail as directed and we will return your call as soon as possible. Messages left after 4 pm will be answered the following business day.   You may also send us a message via MyChart. We typically respond to MyChart messages within 1-2 business days.  For prescription refills, please ask your pharmacy to contact our office. Our fax number is [phone].  If you have an urgent issue when the clinic is closed that  cannot wait until the next business day, you can page your doctor at the number below.    Please note that while we do our best to be available for urgent issues outside of office hours, we are not available 24/7.   If you have an urgent issue and are unable to reach us, you may choose to seek medical care at your doctor's office, retail clinic, urgent care center, or emergency room.  If you have a medical emergency, please immediately call 911 or go to the emergency department.  Pager Numbers  - Dr. Kowalski: [phone]  - Dr. Moye: [phone]  - Dr. Stewart: [phone]  In the event of inclement weather, please call our main line at [phone] for an update on the status of any delays or closures.  Dermatology Medication Tips: Please keep the boxes that topical medications come in in order to help keep track of the instructions about where and how to use these. Pharmacies typically print the medication instructions only on the boxes and not directly on the medication tubes.   If your medication is too expensive, please contact our office at [phone] option 4 or send us a message through MyChart.   We are unable to tell what your co-pay for medications will be in advance as this is different depending on your insurance coverage. However, we may be able to find a substitute medication at lower cost or fill out paperwork to get insurance to cover a needed   medication.   If a prior authorization is required to get your medication covered by your insurance company, please allow us 1-2 business days to complete this process.  Drug prices often vary depending on where the prescription is filled and some pharmacies may offer cheaper prices.  The website www.goodrx.com contains coupons for medications through different pharmacies. The prices here do not account for what the cost may be with help from insurance (it may be cheaper with your insurance), but the website can give you the  price if you did not use any insurance.  - You can print the associated coupon and take it with your prescription to the pharmacy.  - You may also stop by our office during regular business hours and pick up a GoodRx coupon card.  - If you need your prescription sent electronically to a different pharmacy, notify our office through [facility] MyChart or by phone at [phone] option 4.   

## 2020-09-28 NOTE — Progress Notes (Signed)
Follow-Up Visit   Subjective  Haley Rollins is a 87 y.o. female who presents for the following: Skin Problem (Patient here today for a spot at left shoulder. Patient had scratched a spot off and it came back about 3-4 months ago. It is not painful and has not bled. Patient has a spot at left forearm that feels about the same. ).  Patient with no hx of skin cancer.   The following portions of the chart were reviewed this encounter and updated as appropriate:   Tobacco  Allergies  Meds  Problems  Med Hx  Surg Hx  Fam Hx      Review of Systems:  No other skin or systemic complaints except as noted in HPI or Assessment and Plan.  Objective  Well appearing patient in no apparent distress; mood and affect are within normal limits.  A focused examination was performed including shoulder, neck, arms, legs. Relevant physical exam findings are noted in the Assessment and Plan.  Left posterior shoulder 0.7cm cutaneous horn within 1.6cm erythematous light brown patch R/o SCC     Right upper chest erythematous to violaceous tan pedunculated 3 mm papule (1 mm at base)  Right Forearm Verrucous papules -- Discussed viral etiology and contagion.    Assessment & Plan  Neoplasm of uncertain behavior of skin Left posterior shoulder  Skin / nail biopsy Type of biopsy: tangential   Informed consent: discussed and consent obtained   Timeout: patient name, date of birth, surgical site, and procedure verified   Patient was prepped and draped in usual sterile fashion: Area prepped with isopropyl alcohol. Anesthesia: the lesion was anesthetized in a standard fashion   Anesthetic:  1% lidocaine w/ epinephrine 1-100,000 buffered w/ 8.4% NaHCO3 Instrument used: flexible razor blade   Hemostasis achieved with: aluminum chloride   Outcome: patient tolerated procedure well   Post-procedure details: wound care instructions given   Additional details:  Mupirocin and a bandage  applied  Specimen 1 - Surgical pathology Differential Diagnosis: r/o SCC  Check Margins: No 0.7cm cutaneous horn within 1.6cm erythematous light brown patch   Skin tag Right upper chest  Traumatized, symptomatic - Benign appearing.  - Prior to the procedure, reviewed the expected small wound. Also reviewed the risk of leaving a small scar and the small risk of infection.  PROCEDURE - The areas were prepped with isopropyl alcohol. A small amount of lidocaine 1% with epinephrine was injected at the base of each lesion to achieve good local anesthesia. The skin tag was removed using a snip technique. Aluminum chloride was used for hemostasis. Petrolatum and a bandage were applied. The procedure was tolerated well. - Wound care was reviewed with the patient. They were advised to call with any concerns. Total number of treated acrochordons 1  Other viral warts Right Forearm  Discussed viral etiology and risk of spread.  Discussed multiple treatments may be required to clear warts.  Discussed possible post-treatment dyspigmentation and risk of recurrence.  Prior to procedure, discussed risks of blister formation, small wound, skin dyspigmentation, or rare scar following cryotherapy. Recommend Vaseline ointment to treated areas while healing.   Destruction of lesion - Right Forearm  Destruction method: cryotherapy   Informed consent: discussed and consent obtained   Lesion destroyed using liquid nitrogen: Yes   Cryotherapy cycles:  2 Outcome: patient tolerated procedure well with no complications   Post-procedure details: wound care instructions given    Return for TBSE, as scheduled.  Graciella Belton, RMA,  am acting as scribe for Forest Gleason, MD .  Documentation: I have reviewed the above documentation for accuracy and completeness, and I agree with the above.  Forest Gleason, MD

## 2020-10-16 ENCOUNTER — Telehealth: Payer: Self-pay

## 2020-10-16 NOTE — Telephone Encounter (Signed)
Patient's daughter advised bx results showed benign wart./js

## 2020-12-07 ENCOUNTER — Ambulatory Visit: Payer: Medicare Other | Admitting: Dermatology

## 2021-04-10 ENCOUNTER — Encounter: Payer: Self-pay | Admitting: Dermatology

## 2021-04-10 ENCOUNTER — Other Ambulatory Visit: Payer: Self-pay

## 2021-04-10 ENCOUNTER — Ambulatory Visit (INDEPENDENT_AMBULATORY_CARE_PROVIDER_SITE_OTHER): Payer: Medicare Other | Admitting: Dermatology

## 2021-04-10 DIAGNOSIS — D229 Melanocytic nevi, unspecified: Secondary | ICD-10-CM

## 2021-04-10 DIAGNOSIS — L821 Other seborrheic keratosis: Secondary | ICD-10-CM

## 2021-04-10 DIAGNOSIS — L578 Other skin changes due to chronic exposure to nonionizing radiation: Secondary | ICD-10-CM

## 2021-04-10 DIAGNOSIS — L814 Other melanin hyperpigmentation: Secondary | ICD-10-CM

## 2021-04-10 DIAGNOSIS — Q828 Other specified congenital malformations of skin: Secondary | ICD-10-CM

## 2021-04-10 DIAGNOSIS — L570 Actinic keratosis: Secondary | ICD-10-CM | POA: Diagnosis not present

## 2021-04-10 DIAGNOSIS — C4492 Squamous cell carcinoma of skin, unspecified: Secondary | ICD-10-CM

## 2021-04-10 DIAGNOSIS — D1800 Hemangioma unspecified site: Secondary | ICD-10-CM

## 2021-04-10 DIAGNOSIS — D485 Neoplasm of uncertain behavior of skin: Secondary | ICD-10-CM

## 2021-04-10 DIAGNOSIS — Z1283 Encounter for screening for malignant neoplasm of skin: Secondary | ICD-10-CM

## 2021-04-10 DIAGNOSIS — C44329 Squamous cell carcinoma of skin of other parts of face: Secondary | ICD-10-CM | POA: Diagnosis not present

## 2021-04-10 HISTORY — DX: Squamous cell carcinoma of skin, unspecified: C44.92

## 2021-04-10 NOTE — Progress Notes (Signed)
Follow-Up Visit   Subjective  Haley Rollins is a 88 y.o. female who presents for the following: FBSE (Patient here for full body skin exam and skin cancer screening. Patient with no hx of skin cancer. Patient has a spot at face that won't heal. ).   The following portions of the chart were reviewed this encounter and updated as appropriate:   Tobacco   Allergies   Meds   Problems   Med Hx   Surg Hx   Fam Hx       Review of Systems:  No other skin or systemic complaints except as noted in HPI or Assessment and Plan.  Objective  Well appearing patient in no apparent distress; mood and affect are within normal limits.  A full examination was performed including scalp, head, eyes, ears, nose, lips, neck, chest, axillae, abdomen, back, buttocks, bilateral upper extremities, bilateral lower extremities, hands, feet, fingers, toes, fingernails, and toenails. All findings within normal limits unless otherwise noted below.  right pretibia Thin pink plaque with peripheral scale  right preauricular x 1, right cheek x 1, left lateral inferior brow x 1, left cheek x 1 (4) Erythematous thin papules/macules with gritty scale.   left cheek 1.2 cm scaly pink plaque R/o SCC vs BCC over ISK       Assessment & Plan  Porokeratosis right pretibia  Benign-appearing.  Observation.  Call clinic for new or changing lesions.  Recommend daily use of broad spectrum spf 30+ sunscreen to sun-exposed areas.   Patient to RTC if any changes.   AK (actinic keratosis) (4) right preauricular x 1, right cheek x 1, left lateral inferior brow x 1, left cheek x 1  Prior to procedure, discussed risks of blister formation, small wound, skin dyspigmentation, or rare scar following cryotherapy. Recommend Vaseline ointment to treated areas while healing.  Actinic keratoses are precancerous spots that appear secondary to cumulative UV radiation exposure/sun exposure over time. They are chronic with expected  duration over 1 year. A portion of actinic keratoses will progress to squamous cell carcinoma of the skin. It is not possible to reliably predict which spots will progress to skin cancer and so treatment is recommended to prevent development of skin cancer.  Recommend daily broad spectrum sunscreen SPF 30+ to sun-exposed areas, reapply every 2 hours as needed.  Recommend staying in the shade or wearing long sleeves, sun glasses (UVA+UVB protection) and wide brim hats (4-inch brim around the entire circumference of the hat). Call for new or changing lesions.    Destruction of lesion - right preauricular x 1, right cheek x 1, left lateral inferior brow x 1, left cheek x 1  Destruction method: cryotherapy   Informed consent: discussed and consent obtained   Lesion destroyed using liquid nitrogen: Yes   Cryotherapy cycles:  2 Outcome: patient tolerated procedure well with no complications   Post-procedure details: wound care instructions given    Neoplasm of uncertain behavior of skin left cheek  Skin / nail biopsy Type of biopsy: tangential   Informed consent: discussed and consent obtained   Timeout: patient name, date of birth, surgical site, and procedure verified   Patient was prepped and draped in usual sterile fashion: Area prepped with isopropyl alcohol. Anesthesia: the lesion was anesthetized in a standard fashion   Instrument used: DermaBlade   Hemostasis achieved with: aluminum chloride   Outcome: patient tolerated procedure well   Post-procedure details: wound care instructions given   Additional details:  Mupirocin  and a bandage applied  Specimen 1 - Surgical pathology Differential Diagnosis: R/o SCC vs BCC over ISK  Check Margins: No 1.2 cm scaly pink plaque    Lentigines - Scattered tan macules - Due to sun exposure - Benign-appearing, observe - Recommend daily broad spectrum sunscreen SPF 30+ to sun-exposed areas, reapply every 2 hours as needed. - Call for  any changes  Seborrheic Keratoses - Stuck-on, waxy, tan-brown papules and/or plaques  - Benign-appearing - Discussed benign etiology and prognosis. - Observe - Call for any changes  Melanocytic Nevi - Tan-brown and/or pink-flesh-colored symmetric macules and papules - Benign appearing on exam today - Observation - Call clinic for new or changing moles - Recommend daily use of broad spectrum spf 30+ sunscreen to sun-exposed areas.   Hemangiomas - Red papules - Discussed benign nature - Observe - Call for any changes  Actinic Damage - Chronic condition, secondary to cumulative UV/sun exposure - diffuse scaly erythematous macules with underlying dyspigmentation - Recommend daily broad spectrum sunscreen SPF 30+ to sun-exposed areas, reapply every 2 hours as needed.  - Staying in the shade or wearing long sleeves, sun glasses (UVA+UVB protection) and wide brim hats (4-inch brim around the entire circumference of the hat) are also recommended for sun protection.  - Call for new or changing lesions.  Skin cancer screening performed today.  Return in about 4 months (around 08/08/2021) for AK follow up, 1 year FBSE.  Graciella Belton, RMA, am acting as scribe for Forest Gleason, MD .  Documentation: I have reviewed the above documentation for accuracy and completeness, and I agree with the above.  Forest Gleason, MD

## 2021-04-10 NOTE — Patient Instructions (Addendum)
Cryotherapy Aftercare  Wash gently with soap and water everyday.   Apply Vaseline and Band-Aid daily until healed.   Melanoma ABCDEs  Melanoma is the most dangerous type of skin cancer, and is the leading cause of death from skin disease.  You are more likely to develop melanoma if you: Have light-colored skin, light-colored eyes, or red or blond hair Spend a lot of time in the sun Tan regularly, either outdoors or in a tanning bed Have had blistering sunburns, especially during childhood Have a close family member who has had a melanoma Have atypical moles or large birthmarks  Early detection of melanoma is key since treatment is typically straightforward and cure rates are extremely high if we catch it early.   The first sign of melanoma is often a change in a mole or a new dark spot.  The ABCDE system is a way of remembering the signs of melanoma.  A for asymmetry:  The two halves do not match. B for border:  The edges of the growth are irregular. C for color:  A mixture of colors are present instead of an even brown color. D for diameter:  Melanomas are usually (but not always) greater than 62mm - the size of a pencil eraser. E for evolution:  The spot keeps changing in size, shape, and color.  Please check your skin once per month between visits. You can use a small mirror in front and a large mirror behind you to keep an eye on the back side or your body.   If you see any new or changing lesions before your next follow-up, please call to schedule a visit.  Please continue daily skin protection including broad spectrum sunscreen SPF 30+ to sun-exposed areas, reapplying every 2 hours as needed when you're outdoors.     If You Need Anything After Your Visit  If you have any questions or concerns for your doctor, please call our main line at [phone] and press option 4 to reach your doctor's medical assistant. If no one answers, please leave a voicemail as directed and we will  return your call as soon as possible. Messages left after 4 pm will be answered the following business day.   You may also send [place] a message via [place]. We typically respond to MyChart messages within 1-2 business days.  For prescription refills, please ask your pharmacy to contact our office. Our fax number is [phone].  If you have an urgent issue when the clinic is closed that cannot wait until the next business day, you can page your doctor at the number below.    Please note that while we do our best to be available for urgent issues outside of office hours, we are not available 24/7.   If you have an urgent issue and are unable to reach [place], you may choose to seek medical care at your doctor's office, retail clinic, urgent care center, or emergency room.  If you have a medical emergency, please immediately call 911 or go to the emergency department.  Pager Numbers  - Dr. Nehemiah Massed: [phone]  - Dr. Laurence Ferrari: [phone]  - Dr. Nicole Kindred: [phone]  In the event of inclement weather, please call our main line at [phone] for an update on the status of any delays or closures.  Dermatology Medication Tips: Please keep the boxes that topical medications come in in order to help keep track of the instructions about where and how to use these. Pharmacies typically print the medication  instructions only on the boxes and not directly on the medication tubes.   If your medication is too expensive, please contact our office at [phone] option 4 or send [place] a message through [place].   We are unable to tell what your co-pay for medications will be in advance as this is different depending on your insurance coverage. However, we may be able to find a substitute medication at lower cost or fill out paperwork to get insurance to cover a needed medication.   If a prior authorization is required to get your medication covered by your insurance company, please allow [place] 1-2 business  days to complete this process.  Drug prices often vary depending on where the prescription is filled and some pharmacies may offer cheaper prices.  The website www.goodrx.com contains coupons for medications through different pharmacies. The prices here do not account for what the cost may be with help from insurance (it may be cheaper with your insurance), but the website can give you the price if you did not use any insurance.  - You can print the associated coupon and take it with your prescription to the pharmacy.  - You may also stop by our office during regular business hours and pick up a GoodRx coupon card.  - If you need your prescription sent electronically to a different pharmacy, notify our office through [facility] or by phone at [phone] option 4.     Si Usted Necesita Algo Despus de Su Visita  Tambin puede enviarnos un mensaje a travs de Pharmacist, community. Por lo general respondemos a los mensajes de MyChart en el transcurso de 1 a 2 das hbiles.  Para renovar recetas, por favor pida a su farmacia que se ponga en contacto con nuestra oficina. Harland Dingwall de fax es [place] [phone].  Si tiene un asunto urgente cuando la clnica est cerrada y que no puede esperar hasta el siguiente da hbil, puede llamar/localizar a su doctor(a) al nmero que aparece a continuacin.   Por favor, tenga en cuenta que aunque hacemos todo lo posible para estar disponibles para asuntos urgentes fuera del horario de [place], no estamos disponibles las 24 horas del da, los 7 das de la [place].   Si tiene un problema urgente y no puede comunicarse con nosotros, puede optar por buscar atencin mdica  en el consultorio de su doctor(a), en una clnica privada, en un centro de atencin urgente o en una sala de emergencias.  Si tiene Engineering geologist, por favor llame inmediatamente al 911 o vaya a la sala de emergencias.  Nmeros de bper  - Dr. Nehemiah Massed: [phone]  - Dra. Moye:  [phone]  - Dra. Nicole Kindred: [phone]  En caso de inclemencias del [place], por favor llame a Johnsie Kindred principal al [phone] para una actualizacin sobre el [place] de cualquier retraso o cierre.  Consejos para la medicacin en dermatologa: Por favor, guarde las cajas en las que vienen los medicamentos de uso tpico para ayudarle a seguir las instrucciones sobre dnde y cmo usarlos. Las farmacias generalmente imprimen las instrucciones del medicamento slo en las cajas y no directamente en los tubos del [place].   Si su medicamento es muy caro, por favor, pngase en contacto con Zigmund Daniel llamando al [phone] y presione la opcin 4 o envenos un mensaje a travs de Pharmacist, community.   No podemos decirle cul ser su copago por los medicamentos por adelantado ya que esto es diferente dependiendo de la cobertura de su seguro. Sin embargo,  es posible que podamos encontrar un medicamento sustituto a Electrical engineer un formulario para que el seguro cubra el medicamento que se considera necesario.   Si se requiere una autorizacin previa para que su compaa de seguros [place] su medicamento, por favor permtanos de 1 a 2 das hbiles para completar este proceso.  Los precios de los medicamentos varan con frecuencia dependiendo del Environmental consultant de dnde se surte la receta y alguna farmacias pueden ofrecer precios ms baratos.  El sitio web www.goodrx.com tiene cupones para medicamentos de Airline pilot. Los precios aqu no tienen en cuenta lo que podra costar con la ayuda del seguro (puede ser ms barato con su seguro), pero el sitio web puede darle el precio si no utiliz Research scientist (physical sciences).  - Puede imprimir el cupn correspondiente y llevarlo con su receta a la farmacia.  - Tambin puede pasar por nuestra oficina durante el horario de atencin regular y Charity fundraiser una tarjeta de cupones de GoodRx.  - Si necesita que su receta se enve electrnicamente a una farmacia diferente,  informe a nuestra oficina a travs de MyChart de [place] o por telfono llamando al [phone] y presione la opcin 4.

## 2021-04-11 ENCOUNTER — Other Ambulatory Visit: Payer: Self-pay

## 2021-04-11 DIAGNOSIS — C4492 Squamous cell carcinoma of skin, unspecified: Secondary | ICD-10-CM

## 2021-06-06 ENCOUNTER — Telehealth: Payer: Self-pay

## 2021-06-06 NOTE — Telephone Encounter (Signed)
Updated history and specimen tracking from [facility] progress notes/photos. aw ?

## 2021-10-04 ENCOUNTER — Ambulatory Visit (INDEPENDENT_AMBULATORY_CARE_PROVIDER_SITE_OTHER): Payer: Medicare Other | Admitting: Dermatology

## 2021-10-04 DIAGNOSIS — D492 Neoplasm of unspecified behavior of bone, soft tissue, and skin: Secondary | ICD-10-CM

## 2021-10-04 DIAGNOSIS — D0439 Carcinoma in situ of skin of other parts of face: Secondary | ICD-10-CM

## 2021-10-04 DIAGNOSIS — I781 Nevus, non-neoplastic: Secondary | ICD-10-CM

## 2021-10-04 DIAGNOSIS — L821 Other seborrheic keratosis: Secondary | ICD-10-CM | POA: Diagnosis not present

## 2021-10-04 DIAGNOSIS — Z85828 Personal history of other malignant neoplasm of skin: Secondary | ICD-10-CM | POA: Diagnosis not present

## 2021-10-04 DIAGNOSIS — L570 Actinic keratosis: Secondary | ICD-10-CM | POA: Diagnosis not present

## 2021-10-04 DIAGNOSIS — D099 Carcinoma in situ, unspecified: Secondary | ICD-10-CM

## 2021-10-04 HISTORY — DX: Carcinoma in situ, unspecified: D09.9

## 2021-10-04 NOTE — Patient Instructions (Addendum)
Wound Care Instructions  Cleanse wound gently with soap and water once a day then pat dry with clean gauze. Apply a thin coat of Petrolatum (petroleum jelly, "Vaseline") over the wound (unless you have an allergy to this). We recommend that you use a new, sterile tube of Vaseline. Do not pick or remove scabs. Do not remove the yellow or white "healing tissue" from the base of the wound.  Cover the wound with fresh, clean, nonstick gauze and secure with paper tape. You may use Band-Aids in place of gauze and tape if the wound is small enough, but would recommend trimming much of the tape off as there is often too much. Sometimes Band-Aids can irritate the skin.  You should call the office for your biopsy report after 1 week if you have not already been contacted.  If you experience any problems, such as abnormal amounts of bleeding, swelling, significant bruising, significant pain, or evidence of infection, please call the office immediately.  FOR ADULT SURGERY PATIENTS: If you need something for pain relief you may take 1 extra strength Tylenol (acetaminophen) AND 2 Ibuprofen (200mg each) together every 4 hours as needed for pain. (do not take these if you are allergic to them or if you have a reason you should not take them.) Typically, you may only need pain medication for 1 to 3 days.     Cryotherapy Aftercare  Wash gently with soap and water everyday.   Apply Vaseline and Band-Aid daily until healed.    Due to recent changes in healthcare laws, you may see results of your pathology and/or laboratory studies on MyChart before the doctors have had a chance to review them. We understand that in some cases there may be results that are confusing or concerning to you. Please understand that not all results are received at the same time and often the doctors may need to interpret multiple results in order to provide you with the best plan of care or course of treatment. Therefore, we ask that you  please give us 2 business days to thoroughly review all your results before contacting the office for clarification. Should we see a critical lab result, you will be contacted sooner.   If You Need Anything After Your Visit  If you have any questions or concerns for your doctor, please call our main line at [phone] and press option 4 to reach your doctor's medical assistant. If no one answers, please leave a voicemail as directed and we will return your call as soon as possible. Messages left after 4 pm will be answered the following business day.   You may also send us a message via MyChart. We typically respond to MyChart messages within 1-2 business days.  For prescription refills, please ask your pharmacy to contact our office. Our fax number is [phone].  If you have an urgent issue when the clinic is closed that cannot wait until the next business day, you can page your doctor at the number below.    Please note that while we do our best to be available for urgent issues outside of office hours, we are not available 24/7.   If you have an urgent issue and are unable to reach us, you may choose to seek medical care at your doctor's office, retail clinic, urgent care center, or emergency room.  If you have a medical emergency, please immediately call 911 or go to the emergency department.  Pager Numbers  - Dr. Kowalski: [phone]  -   Dr. Moye: [phone]  - Dr. Stewart: [phone]  In the event of inclement weather, please call our main line at [phone] for an update on the status of any delays or closures.  Dermatology Medication Tips: Please keep the boxes that topical medications come in in order to help keep track of the instructions about where and how to use these. Pharmacies typically print the medication instructions only on the boxes and not directly on the medication tubes.   If your medication is too expensive, please contact our office at  [phone] option 4 or send us a message through MyChart.   We are unable to tell what your co-pay for medications will be in advance as this is different depending on your insurance coverage. However, we may be able to find a substitute medication at lower cost or fill out paperwork to get insurance to cover a needed medication.   If a prior authorization is required to get your medication covered by your insurance company, please allow us 1-2 business days to complete this process.  Drug prices often vary depending on where the prescription is filled and some pharmacies may offer cheaper prices.  The website www.goodrx.com contains coupons for medications through different pharmacies. The prices here do not account for what the cost may be with help from insurance (it may be cheaper with your insurance), but the website can give you the price if you did not use any insurance.  - You can print the associated coupon and take it with your prescription to the pharmacy.  - You may also stop by our office during regular business hours and pick up a GoodRx coupon card.  - If you need your prescription sent electronically to a different pharmacy, notify our office through [place] MyChart or by phone at [phone] option 4.     Si Usted Necesita Algo Despus de Su Visita  Tambin puede enviarnos un mensaje a travs de MyChart. Por lo general respondemos a los mensajes de MyChart en el transcurso de 1 a 2 das hbiles.  Para renovar recetas, por favor pida a su farmacia que se ponga en contacto con nuestra oficina. Nuestro nmero de fax es el [phone].  Si tiene un asunto urgente cuando la clnica est cerrada y que no puede esperar hasta el siguiente da hbil, puede llamar/localizar a su doctor(a) al nmero que aparece a continuacin.   Por favor, tenga en cuenta que aunque hacemos todo lo posible para estar disponibles para asuntos urgentes fuera del horario de oficina, no estamos  disponibles las 24 horas del da, los 7 das de la semana.   Si tiene un problema urgente y no puede comunicarse con nosotros, puede optar por buscar atencin mdica  en el consultorio de su doctor(a), en una clnica privada, en un centro de atencin urgente o en una sala de emergencias.  Si tiene una emergencia mdica, por favor llame inmediatamente al 911 o vaya a la sala de emergencias.  Nmeros de bper  - Dr. Kowalski: [phone]  - Dra. Moye: [phone]  - Dra. Stewart: [phone]  En caso de inclemencias del tiempo, por favor llame a nuestra lnea principal al [phone] para una actualizacin sobre el estado de cualquier retraso o cierre.  Consejos para la medicacin en dermatologa: Por favor, guarde las cajas en las que vienen los medicamentos de uso tpico para ayudarle a seguir las instrucciones sobre dnde y cmo usarlos. Las farmacias generalmente imprimen las instrucciones del medicamento slo en las cajas y   no directamente en los tubos del medicamento.   Si su medicamento es muy caro, por favor, pngase en contacto con nuestra oficina llamando al [phone] y presione la opcin 4 o envenos un mensaje a travs de MyChart.   No podemos decirle cul ser su copago por los medicamentos por adelantado ya que esto es diferente dependiendo de la cobertura de su seguro. Sin embargo, es posible que podamos encontrar un medicamento sustituto a menor costo o llenar un formulario para que el seguro cubra el medicamento que se considera necesario.   Si se requiere una autorizacin previa para que su compaa de seguros cubra su medicamento, por favor permtanos de 1 a 2 das hbiles para completar este proceso.  Los precios de los medicamentos varan con frecuencia dependiendo del lugar de dnde se surte la receta y alguna farmacias pueden ofrecer precios ms baratos.  El sitio web www.goodrx.com tiene cupones para medicamentos de diferentes farmacias. Los precios aqu no  tienen en cuenta lo que podra costar con la ayuda del seguro (puede ser ms barato con su seguro), pero el sitio web puede darle el precio si no utiliz ningn seguro.  - Puede imprimir el cupn correspondiente y llevarlo con su receta a la farmacia.  - Tambin puede pasar por nuestra oficina durante el horario de atencin regular y recoger una tarjeta de cupones de GoodRx.  - Si necesita que su receta se enve electrnicamente a una farmacia diferente, informe a nuestra oficina a travs de MyChart de [place] o por telfono llamando al [phone] y presione la opcin 4.  

## 2021-10-04 NOTE — Progress Notes (Signed)
Follow-Up Visit   Subjective  Haley Rollins is a 88 y.o. female who presents for the following: Follow-up (Patient here today for [place] follow up. She does have a spot at right temple. ).  The following portions of the chart were reviewed this encounter and updated as appropriate:   Tobacco  Allergies  Meds  Problems  Med Hx  Surg Hx  Fam Hx      Review of Systems:  No other skin or systemic complaints except as noted in HPI or Assessment and Plan.  Objective  Well appearing patient in no apparent distress; mood and affect are within normal limits.  A focused examination was performed including face. Relevant physical exam findings are noted in the Assessment and Plan.  Right Forehead Telangiectasia without features suspicious for malignancy on dermoscopy   Right preauricular 1.1 cm very thin pink plaque  right medial cheek x 1, left mid cheek x 1 (2) Erythematous thin papules/macules with gritty scale.     Assessment & Plan  Telangiectasia Right Forehead  Benign-appearing.  Observation.  Call clinic for new or changing lesions.  Recommend daily use of broad spectrum spf 30+ sunscreen to sun-exposed areas.    Neoplasm of skin Right preauricular  Skin / nail biopsy Type of biopsy: tangential   Informed consent: discussed and consent obtained   Timeout: patient name, date of birth, surgical site, and procedure verified   Procedure prep:  Patient was prepped and draped in usual sterile fashion Prep type:  Isopropyl alcohol Anesthesia: the lesion was anesthetized in a standard fashion   Anesthetic:  1% lidocaine w/ epinephrine 1-100,000 buffered w/ 8.4% NaHCO3 Instrument used: flexible razor blade   Hemostasis achieved with: aluminum chloride   Outcome: patient tolerated procedure well   Post-procedure details: wound care instructions given   Additional details:  Petrolatum and a pressure bandage applied  Specimen 1 - Surgical pathology Differential  Diagnosis: r/o BCC  Check Margins: No 1.1 cm very thin pink plaque  AK (actinic keratosis) (2) right medial cheek x 1, left mid cheek x 1  Actinic keratoses are precancerous spots that appear secondary to cumulative UV radiation exposure/sun exposure over time. They are chronic with expected duration over 1 year. A portion of actinic keratoses will progress to squamous cell carcinoma of the skin. It is not possible to reliably predict which spots will progress to skin cancer and so treatment is recommended to prevent development of skin cancer.  Recommend daily broad spectrum sunscreen SPF 30+ to sun-exposed areas, reapply every 2 hours as needed.  Recommend staying in the shade or wearing long sleeves, sun glasses (UVA+UVB protection) and wide brim hats (4-inch brim around the entire circumference of the hat). Call for new or changing lesions.  Prior to procedure, discussed risks of blister formation, small wound, skin dyspigmentation, or rare scar following cryotherapy. Recommend Vaseline ointment to treated areas while healing.   Destruction of lesion - right medial cheek x 1, left mid cheek x 1  Destruction method: cryotherapy   Informed consent: discussed and consent obtained   Lesion destroyed using liquid nitrogen: Yes   Cryotherapy cycles:  2 Outcome: patient tolerated procedure well with no complications   Post-procedure details: wound care instructions given     History of Squamous Cell Carcinoma of the Skin - No evidence of recurrence today at left cheek - No lymphadenopathy - Recommend regular full body skin exams - Recommend daily broad spectrum sunscreen SPF 30+ to sun-exposed areas, reapply  every 2 hours as needed.  - Call if any new or changing lesions are noted between office visits  Seborrheic Keratoses - Stuck-on, waxy, tan-brown papules and/or plaques  - Benign-appearing - Discussed benign etiology and prognosis. - Observe - Call for any changes  Return  for as scheduled, TBSE.  Graciella Belton, RMA, am acting as scribe for Forest Gleason, MD .  Documentation: I have reviewed the above documentation for accuracy and completeness, and I agree with the above.  Forest Gleason, MD

## 2021-10-14 ENCOUNTER — Encounter: Payer: Self-pay | Admitting: Dermatology

## 2021-10-31 ENCOUNTER — Telehealth: Payer: Self-pay

## 2021-10-31 ENCOUNTER — Ambulatory Visit: Payer: Medicare Other | Admitting: Dermatology

## 2021-10-31 NOTE — Telephone Encounter (Signed)
Appointment scheduled for [facility] of [facility]. JP

## 2021-10-31 NOTE — Telephone Encounter (Signed)
-----   Message from Alfonso Patten, MD sent at 10/10/2021  4:53 PM EDT ----- Skin , right preauricular SQUAMOUS CELL CARCINOMA IN SITU, PERIPHERAL MARGIN INVOLVED --> Mohs vs ED&C Discussed results with daughter. She will discuss with her mom and they will let us know which they prefer. Ms. Hoes had a tough time with her recent Mohs surgery and at her visit was leaning towards an [facility].  MAs please call tomorrow to schedule for [facility] or refer if they decide they want Mohs. Thank you!  CC Dr. Caryl Comes

## 2021-11-01 ENCOUNTER — Ambulatory Visit: Payer: Medicare Other | Admitting: Dermatology

## 2021-11-20 ENCOUNTER — Encounter: Payer: Self-pay | Admitting: Dermatology

## 2021-11-20 ENCOUNTER — Ambulatory Visit (INDEPENDENT_AMBULATORY_CARE_PROVIDER_SITE_OTHER): Payer: Medicare Other | Admitting: Dermatology

## 2021-11-20 DIAGNOSIS — D0439 Carcinoma in situ of skin of other parts of face: Secondary | ICD-10-CM

## 2021-11-20 DIAGNOSIS — L821 Other seborrheic keratosis: Secondary | ICD-10-CM

## 2021-11-20 DIAGNOSIS — L570 Actinic keratosis: Secondary | ICD-10-CM | POA: Diagnosis not present

## 2021-11-20 DIAGNOSIS — D049 Carcinoma in situ of skin, unspecified: Secondary | ICD-10-CM

## 2021-11-20 NOTE — Patient Instructions (Signed)
Electrodesiccation and Curettage ("Scrape and Burn") Wound Care Instructions  Leave the original bandage on for 24 hours if possible.  If the bandage becomes soaked or soiled before that time, it is OK to remove it and examine the wound.  A small amount of post-operative bleeding is normal.  If excessive bleeding occurs, remove the bandage, place gauze over the site and apply continuous pressure (no peeking) over the area for 30 minutes. If this does not work, please call our clinic as soon as possible or page your doctor if it is after hours.   Once a day, cleanse the wound with soap and water. It is fine to shower. If a thick crust develops you may use a Q-tip dipped into dilute hydrogen peroxide (mix 1:1 with water) to dissolve it.  Hydrogen peroxide can slow the healing process, so use it only as needed.    After washing, apply petroleum jelly (Vaseline) or an antibiotic ointment if your doctor prescribed one for you, followed by a bandage.    For best healing, the wound should be covered with a layer of ointment at all times. If you are not able to keep the area covered with a bandage to hold the ointment in place, this may mean re-applying the ointment several times a day.  Continue this wound care until the wound has healed and is no longer open. It may take several weeks for the wound to heal and close.  Itching and mild discomfort is normal during the healing process.  If you have any discomfort, you can take Tylenol (acetaminophen) or ibuprofen as directed on the bottle. (Please do not take these if you have an allergy to them or cannot take them for another reason).  Some redness, tenderness and white or yellow material in the wound is normal healing.  If the area becomes very sore and red, or develops a thick yellow-green material (pus), it may be infected; please notify us.    Wound healing continues for up to one year following surgery. It is not unusual to experience pain in the scar  from time to time during the interval.  If the pain becomes severe or the scar thickens, you should notify the office.    A slight amount of redness in a scar is expected for the first six months.  After six months, the redness will fade and the scar will soften and fade.  The color difference becomes less noticeable with time.  If there are any problems, return for a post-op surgery check at your earliest convenience.  To improve the appearance of the scar, you can use silicone scar gel, cream, or sheets (such as Mederma or Serica) every night for up to one year. These are available over the counter (without a prescription).  Please call our office at [phone] for any questions or concerns.   Cryotherapy Aftercare  Wash gently with soap and water everyday.   Apply Vaseline daily until healed.   Recommend daily broad spectrum sunscreen SPF 30+ to sun-exposed areas, reapply every 2 hours as needed. Call for new or changing lesions.  Staying in the shade or wearing long sleeves, sun glasses (UVA+UVB protection) and wide brim hats (4-inch brim around the entire circumference of the hat) are also recommended for sun protection.     Due to recent changes in healthcare laws, you may see results of your pathology and/or laboratory studies on MyChart before the doctors have had a chance to review them. We understand that  in some cases there may be results that are confusing or concerning to you. Please understand that not all results are received at the same time and often the doctors may need to interpret multiple results in order to provide you with the best plan of care or course of treatment. Therefore, we ask that you please give [place] 2 business days to thoroughly review all your results before contacting the office for clarification. Should we see a critical lab result, you will be contacted sooner.   If You Need Anything After Your Visit  If you have any questions or concerns for your  doctor, please call our main line at [phone] and press option 4 to reach your doctor's medical assistant. If no one answers, please leave a voicemail as directed and we will return your call as soon as possible. Messages left after 4 pm will be answered the following business day.   You may also send [place] a message via [place]. We typically respond to MyChart messages within 1-2 business days.  For prescription refills, please ask your pharmacy to contact our office. Our fax number is [phone].  If you have an urgent issue when the clinic is closed that cannot wait until the next business day, you can page your doctor at the number below.    Please note that while we do our best to be available for urgent issues outside of office hours, we are not available 24/7.   If you have an urgent issue and are unable to reach [place], you may choose to seek medical care at your doctor's office, retail clinic, urgent care center, or emergency room.  If you have a medical emergency, please immediately call 911 or go to the emergency department.  Pager Numbers  - Dr. Nehemiah Massed: [phone]  - Dr. Laurence Ferrari: [phone]  - Dr. Nicole Kindred: [phone]  In the event of inclement weather, please call our main line at [phone] for an update on the status of any delays or closures.  Dermatology Medication Tips: Please keep the boxes that topical medications come in in order to help keep track of the instructions about where and how to use these. Pharmacies typically print the medication instructions only on the boxes and not directly on the medication tubes.   If your medication is too expensive, please contact our office at [phone] option 4 or send [place] a message through [place].   We are unable to tell what your co-pay for medications will be in advance as this is different depending on your insurance coverage. However, we may be able to find a substitute medication at lower cost or fill out paperwork  to get insurance to cover a needed medication.   If a prior authorization is required to get your medication covered by your insurance company, please allow [place] 1-2 business days to complete this process.  Drug prices often vary depending on where the prescription is filled and some pharmacies may offer cheaper prices.  The website www.goodrx.com contains coupons for medications through different pharmacies. The prices here do not account for what the cost may be with help from insurance (it may be cheaper with your insurance), but the website can give you the price if you did not use any insurance.  - You can print the associated coupon and take it with your prescription to the pharmacy.  - You may also stop by our office during regular business hours and pick up a GoodRx coupon card.  - If you need your  prescription sent electronically to a different pharmacy, notify our office through [facility] or by phone at [phone] option 4.     Si Usted Necesita Algo Despus de Su Visita  Tambin puede enviarnos un mensaje a travs de Pharmacist, community. Por lo general respondemos a los mensajes de MyChart en el transcurso de 1 a 2 das hbiles.  Para renovar recetas, por favor pida a su farmacia que se ponga en contacto con nuestra oficina. Harland Dingwall de fax es [place] [phone].  Si tiene un asunto urgente cuando la clnica est cerrada y que no puede esperar hasta el siguiente da hbil, puede llamar/localizar a su doctor(a) al nmero que aparece a continuacin.   Por favor, tenga en cuenta que aunque hacemos todo lo posible para estar disponibles para asuntos urgentes fuera del horario de [place], no estamos disponibles las 24 horas del da, los 7 das de la [place].   Si tiene un problema urgente y no puede comunicarse con nosotros, puede optar por buscar atencin mdica  en el consultorio de su doctor(a), en una clnica privada, en un centro de atencin urgente o en una sala de  emergencias.  Si tiene Engineering geologist, por favor llame inmediatamente al 911 o vaya a la sala de emergencias.  Nmeros de bper  - Dr. Nehemiah Massed: [phone]  - Dra. Moye: [phone]  - Dra. Nicole Kindred: [phone]  En caso de inclemencias del [place], por favor llame a Johnsie Kindred principal al [phone] para una actualizacin sobre el [place] de cualquier retraso o cierre.  Consejos para la medicacin en dermatologa: Por favor, guarde las cajas en las que vienen los medicamentos de uso tpico para ayudarle a seguir las instrucciones sobre dnde y cmo usarlos. Las farmacias generalmente imprimen las instrucciones del medicamento slo en las cajas y no directamente en los tubos del [place].   Si su medicamento es muy caro, por favor, pngase en contacto con Zigmund Daniel llamando al [phone] y presione la opcin 4 o envenos un mensaje a travs de Pharmacist, community.   No podemos decirle cul ser su copago por los medicamentos por adelantado ya que esto es diferente dependiendo de la cobertura de su seguro. Sin embargo, es posible que podamos encontrar un medicamento sustituto a Electrical engineer un formulario para que el seguro cubra el medicamento que se considera necesario.   Si se requiere una autorizacin previa para que su compaa de seguros [place] su medicamento, por favor permtanos de 1 a 2 das hbiles para completar este proceso.  Los precios de los medicamentos varan con frecuencia dependiendo del Environmental consultant de dnde se surte la receta y alguna farmacias pueden ofrecer precios ms baratos.  El sitio web www.goodrx.com tiene cupones para medicamentos de Airline pilot. Los precios aqu no tienen en cuenta lo que podra costar con la ayuda del seguro (puede ser ms barato con su seguro), pero el sitio web puede darle el precio si no utiliz Research scientist (physical sciences).  - Puede imprimir el cupn correspondiente y llevarlo con su receta a la farmacia.  - Tambin puede pasar por  nuestra oficina durante el horario de atencin regular y Charity fundraiser una tarjeta de cupones de GoodRx.  - Si necesita que su receta se enve electrnicamente a una farmacia diferente, informe a nuestra oficina a travs de MyChart de [place] o por telfono llamando al [phone] y presione la opcin 4.

## 2021-11-20 NOTE — Progress Notes (Addendum)
Follow-Up Visit   Subjective  Haley Rollins is a 88 y.o. female who presents for the following: Skin Cancer (Here for [facility] treatment of [facility]. Bx proven 10/04/2021. Right preauricular).  The patient has spots, moles and lesions to be evaluated, some may be new or changing and the patient has concerns that these could be cancer.   The following portions of the chart were reviewed this encounter and updated as appropriate:  Tobacco  Allergies  Meds  Problems  Med Hx  Surg Hx  Fam Hx      Review of Systems: No other skin or systemic complaints except as noted in HPI or Assessment and Plan.   Objective  Well appearing patient in no apparent distress; mood and affect are within normal limits.  A focused examination was performed including face. Relevant physical exam findings are noted in the Assessment and Plan.  mid left cheek x1, left upper eyelid x1 (2) Erythematous thin papules/macules with gritty scale.   right preauricular Pink plaque      Assessment & Plan  AK (actinic keratosis) (2) mid left cheek x1, left upper eyelid x1  Actinic keratoses are precancerous spots that appear secondary to cumulative UV radiation exposure/sun exposure over time. They are chronic with expected duration over 1 year. A portion of actinic keratoses will progress to squamous cell carcinoma of the skin. It is not possible to reliably predict which spots will progress to skin cancer and so treatment is recommended to prevent development of skin cancer.  Recommend daily broad spectrum sunscreen SPF 30+ to sun-exposed areas, reapply every 2 hours as needed.  Recommend staying in the shade or wearing long sleeves, sun glasses (UVA+UVB protection) and wide brim hats (4-inch brim around the entire circumference of the hat). Call for new or changing lesions.  Destruction of lesion - mid left cheek x1, left upper eyelid x1  Destruction method: cryotherapy   Informed consent: discussed and  consent obtained   Lesion destroyed using liquid nitrogen: Yes   Region frozen until ice ball extended beyond lesion: Yes   Outcome: patient tolerated procedure well with no complications   Post-procedure details: wound care instructions given   Additional details:  Prior to procedure, discussed risks of blister formation, small wound, skin dyspigmentation, or rare scar following cryotherapy. Recommend Vaseline ointment to treated areas while healing.   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  Destruction of lesion  Destruction method: electrodesiccation and curettage   Informed consent: discussed and consent obtained   Timeout:  patient name, date of birth, surgical site, and procedure verified Anesthesia: the lesion was anesthetized in a standard fashion   Anesthetic:  1% lidocaine w/ epinephrine 1-100,000 buffered w/ 8.4% NaHCO3 Curettage performed in three different directions: Yes   Electrodesiccation performed over the curetted area: Yes   Curettage cycles:  3 Final wound size (cm):  1.4 Hemostasis achieved with:  electrodesiccation Outcome: patient tolerated procedure well with no complications   Post-procedure details: sterile dressing applied and wound care instructions given   Dressing type: petrolatum    Prior to procedure discussed Mohs vs ED&C vs excision. Pt prefers ED&C  ED&C has about an 85% cure rate and leaves a round wound the size of the skin cancer which is healed with ointment and a bandage over a few weeks time. It leaves a round white scar. No additional pathology is done. If the skin cancer were to come back, we would need to do a surgery  to remove it.   Mohs involves cutting out right around the spot and then checking under the microscope to be sure the whole skin cancer is out. The cure rate is about 98-99%. It is done at another office outside of [place] ([place], [place], or [place]). Once the Mohs surgeon confirms the skin cancer  is out, they will discuss the options to repair or heal the area. You must take it easy for about two weeks after surgery (no lifting over 10-15 lbs, avoid activity to get your heart rate and blood pressure up).     Seborrheic Keratoses. Face  - Stuck-on, waxy, tan-brown papules and/or plaques  - Benign-appearing - Discussed benign etiology and prognosis. - Observe - Call for any changes    Return for TBSE As Scheduled.  I, Emelia Salisbury, CMA, am acting as scribe for Forest Gleason, MD.  Documentation: I have reviewed the above documentation for accuracy and completeness, and I agree with the above.  Forest Gleason, MD

## 2021-11-30 ENCOUNTER — Encounter: Payer: Self-pay | Admitting: Dermatology

## 2022-04-11 ENCOUNTER — Encounter: Payer: Medicare Other | Admitting: Dermatology

## 2022-06-06 ENCOUNTER — Ambulatory Visit: Payer: Medicare HMO | Admitting: Dermatology

## 2022-06-06 ENCOUNTER — Encounter: Payer: Self-pay | Admitting: Dermatology

## 2022-06-06 DIAGNOSIS — Z86007 Personal history of in-situ neoplasm of skin: Secondary | ICD-10-CM

## 2022-06-06 DIAGNOSIS — D485 Neoplasm of uncertain behavior of skin: Secondary | ICD-10-CM | POA: Diagnosis not present

## 2022-06-06 DIAGNOSIS — L570 Actinic keratosis: Secondary | ICD-10-CM | POA: Diagnosis not present

## 2022-06-06 DIAGNOSIS — D492 Neoplasm of unspecified behavior of bone, soft tissue, and skin: Secondary | ICD-10-CM

## 2022-06-06 DIAGNOSIS — L814 Other melanin hyperpigmentation: Secondary | ICD-10-CM

## 2022-06-06 DIAGNOSIS — L821 Other seborrheic keratosis: Secondary | ICD-10-CM | POA: Diagnosis not present

## 2022-06-06 DIAGNOSIS — Z1283 Encounter for screening for malignant neoplasm of skin: Secondary | ICD-10-CM

## 2022-06-06 DIAGNOSIS — D229 Melanocytic nevi, unspecified: Secondary | ICD-10-CM

## 2022-06-06 DIAGNOSIS — L578 Other skin changes due to chronic exposure to nonionizing radiation: Secondary | ICD-10-CM

## 2022-06-06 DIAGNOSIS — Z85828 Personal history of other malignant neoplasm of skin: Secondary | ICD-10-CM

## 2022-06-06 NOTE — Progress Notes (Signed)
Follow-Up Visit   Subjective  Haley Rollins is a 89 y.o. female who presents for the following: Skin Cancer Screening and Full Body Skin Exam  The patient presents for Total-Body Skin Exam (TBSE) for skin cancer screening and mole check. The patient has spots, moles and lesions to be evaluated, some may be new or changing and the patient has concerns that these could be cancer.   The following portions of the chart were reviewed this encounter and updated as appropriate: medications, allergies, medical history  Review of Systems:  No other skin or systemic complaints except as noted in HPI or Assessment and Plan.  Objective  Well appearing patient in no apparent distress; mood and affect are within normal limits.  A full examination was performed including scalp, head, eyes, ears, nose, lips, neck, chest, axillae, abdomen, back, buttocks, bilateral upper extremities, bilateral lower extremities, hands, feet, fingers, toes, fingernails, and toenails. All findings within normal limits unless otherwise noted below.   Relevant physical exam findings are noted in the Assessment and Plan.  right calf Erythematous thin papules/macules with gritty scale.   right pretibia 1.8 cm scaly pink plaque R/o SCC         Assessment & Plan   LENTIGINES, SEBORRHEIC KERATOSES, HEMANGIOMAS - Benign normal skin lesions - Benign-appearing - Call for any changes  MELANOCYTIC NEVI - Tan-brown and/or pink-flesh-colored symmetric macules and papules - Benign appearing on exam today - Observation - Call clinic for new or changing moles - Recommend daily use of broad spectrum spf 30+ sunscreen to sun-exposed areas.   ACTINIC DAMAGE - Chronic condition, secondary to cumulative UV/sun exposure - diffuse scaly erythematous macules with underlying dyspigmentation - Recommend daily broad spectrum sunscreen SPF 30+ to sun-exposed areas, reapply every 2 hours as needed.  - Staying in the shade or  wearing long sleeves, sun glasses (UVA+UVB protection) and wide brim hats (4-inch brim around the entire circumference of the hat) are also recommended for sun protection.  - Call for new or changing lesions.  SKIN CANCER SCREENING PERFORMED TODAY.  HISTORY OF SQUAMOUS CELL CARCINOMA IN SITU OF THE SKIN - No evidence of recurrence today at right preauricular - Recommend regular full body skin exams - Recommend daily broad spectrum sunscreen SPF 30+ to sun-exposed areas, reapply every 2 hours as needed.  - Call if any new or changing lesions are noted between office visits  HISTORY OF SQUAMOUS CELL CARCINOMA OF THE SKIN - No evidence of recurrence today at left cheek - No lymphadenopathy - Recommend regular full body skin exams - Recommend daily broad spectrum sunscreen SPF 30+ to sun-exposed areas, reapply every 2 hours as needed.  - Call if any new or changing lesions are noted between office visits  AK (actinic keratosis) right calf  Actinic keratoses are precancerous spots that appear secondary to cumulative UV radiation exposure/sun exposure over time. They are chronic with expected duration over 1 year. A portion of actinic keratoses will progress to squamous cell carcinoma of the skin. It is not possible to reliably predict which spots will progress to skin cancer and so treatment is recommended to prevent development of skin cancer.  Recommend daily broad spectrum sunscreen SPF 30+ to sun-exposed areas, reapply every 2 hours as needed.  Recommend staying in the shade or wearing long sleeves, sun glasses (UVA+UVB protection) and wide brim hats (4-inch brim around the entire circumference of the hat). Call for new or changing lesions.  Hypertrophic at right calf  Thin  Aks x 2 at right cheek. Defer treatment today to right cheek, monitor  Destruction of lesion - right calf  Destruction method: cryotherapy   Informed consent: discussed and consent obtained   Lesion destroyed  using liquid nitrogen: Yes   Cryotherapy cycles:  2 Outcome: patient tolerated procedure well with no complications   Post-procedure details: wound care instructions given    Neoplasm of skin right pretibia  Skin / nail biopsy Type of biopsy: tangential   Informed consent: discussed and consent obtained   Timeout: patient name, date of birth, surgical site, and procedure verified   Procedure prep:  Patient was prepped and draped in usual sterile fashion Prep type:  Isopropyl alcohol Anesthesia: the lesion was anesthetized in a standard fashion   Anesthetic:  1% lidocaine w/ epinephrine 1-100,000 buffered w/ 8.4% NaHCO3 Instrument used: flexible razor blade   Hemostasis achieved with: aluminum chloride   Outcome: patient tolerated procedure well   Post-procedure details: wound care instructions given   Additional details:  Petrolatum and a pressure bandage applied  Specimen 1 - Surgical pathology Differential Diagnosis: R/o SCC  Check Margins: No 1.8 cm scaly pink plaque    Return for recheck AK at right calf, right cheek in 1 month.  Anise Salvo, RMA, am acting as scribe for Darden Dates, MD .   Documentation: I have reviewed the above documentation for accuracy and completeness, and I agree with the above.  Darden Dates, MD

## 2022-06-06 NOTE — Patient Instructions (Addendum)
Recommend Vitamin D 800 iu daily.  Recommend [facility] Hypochlorous Spray (found in the wound care section) OR Hibaclens to clean area at right lower leg. The [facility] Hypochlorous Spray can be sprayed on daily and left on.    Wound Care Instructions  Cleanse wound gently with soap and water once a day then pat dry with clean gauze. Apply a thin coat of Petrolatum (petroleum jelly, "Vaseline") over the wound (unless you have an allergy to this). We recommend that you use a new, sterile tube of Vaseline. Do not pick or remove scabs. Do not remove the yellow or white "healing tissue" from the base of the wound.  Cover the wound with fresh, clean, nonstick gauze and secure with paper tape. You may use Band-Aids in place of gauze and tape if the wound is small enough, but would recommend trimming much of the tape off as there is often too much. Sometimes Band-Aids can irritate the skin.  You should call the office for your biopsy report after 1 week if you have not already been contacted.  If you experience any problems, such as abnormal amounts of bleeding, swelling, significant bruising, significant pain, or evidence of infection, please call the office immediately.

## 2022-06-12 ENCOUNTER — Telehealth: Payer: Self-pay

## 2022-06-12 NOTE — Telephone Encounter (Signed)
-----   Message from [place], MD sent at 06/11/2022  5:28 PM EDT ----- Skin , right pretibia HYPERTROPHIC ACTINIC KERATOSIS --> we could freeze at follow-up or treat with a cream twice a day for 7-14 days to kill off the precancer cells. We can recheck the area and decide on treatment at her f/u next month if she would like.   MAs please call. Thank you!

## 2022-06-12 NOTE — Telephone Encounter (Signed)
Discussed pathology results and treatment options with patient's daughter, Graciella Belton. KSA for recheck. Will Tx AK or prescribe topical at that time.

## 2022-07-09 ENCOUNTER — Encounter: Payer: Self-pay | Admitting: Dermatology

## 2022-07-09 ENCOUNTER — Ambulatory Visit: Payer: Medicare HMO | Admitting: Dermatology

## 2022-07-09 DIAGNOSIS — L570 Actinic keratosis: Secondary | ICD-10-CM | POA: Diagnosis not present

## 2022-07-09 DIAGNOSIS — W908XXA Exposure to other nonionizing radiation, initial encounter: Secondary | ICD-10-CM

## 2022-07-09 NOTE — Progress Notes (Signed)
   Follow-Up Visit   Subjective  Haley Rollins is a 89 y.o. female who presents for the following: Discuss Tx for Bx proven AK at right lower leg.  Recheck AK's Tx on right calf. Possible treatment of AK at right cheek.  The patient has spots, moles and lesions to be evaluated, some may be new or changing and the patient has concerns that these could be cancer.  Caregiver with patient.   The following portions of the chart were reviewed this encounter and updated as appropriate: medications, allergies, medical history  Review of Systems:  No other skin or systemic complaints except as noted in HPI or Assessment and Plan.  Objective  Well appearing patient in no apparent distress; mood and affect are within normal limits.  A focused examination was performed of the following areas: Right leg  Relevant physical exam findings are noted in the Assessment and Plan.  Right pretibial x1, left pretibial x1 (2) Erythematous thin papules/macules with gritty scale.     Assessment & Plan   AK (actinic keratosis) (2) Right pretibial x1, left pretibial x1  Actinic keratoses are precancerous spots that appear secondary to cumulative UV radiation exposure/sun exposure over time. They are chronic with expected duration over 1 year. A portion of actinic keratoses will progress to squamous cell carcinoma of the skin. It is not possible to reliably predict which spots will progress to skin cancer and so treatment is recommended to prevent development of skin cancer.  Recommend daily broad spectrum sunscreen SPF 30+ to sun-exposed areas, reapply every 2 hours as needed.  Recommend staying in the shade or wearing long sleeves, sun glasses (UVA+UVB protection) and wide brim hats (4-inch brim around the entire circumference of the hat). Call for new or changing lesions.  Destruction of lesion - Right pretibial x1, left pretibial x1  Destruction method: cryotherapy   Informed consent: discussed  and consent obtained   Timeout:  patient name, date of birth, surgical site, and procedure verified Lesion destroyed using liquid nitrogen: Yes   Region frozen until ice ball extended beyond lesion: Yes   Outcome: patient tolerated procedure well with no complications   Post-procedure details: wound care instructions given   Additional details:  Prior to procedure, discussed risks of blister formation, small wound, skin dyspigmentation, or rare scar following cryotherapy. Recommend Vaseline ointment to treated areas while healing.     Continue Vaseline Jelly and Band-aid until completely healed.   Will continue to observe very thin Aks x2 at right cheek.   Return for AK Follow Up in 3-4 months.  I, Lawson Radar, CMA, am acting as scribe for Darden Dates, MD.   Documentation: I have reviewed the above documentation for accuracy and completeness, and I agree with the above.  Darden Dates, MD

## 2022-07-09 NOTE — Patient Instructions (Signed)
Continue Vaseline Jelly and Band-aid until completely healed.    Cryotherapy Aftercare  Wash gently with soap and water everyday.   Apply Vaseline and Band-Aid daily until healed.   Recommend daily broad spectrum sunscreen SPF 30+ to sun-exposed areas, reapply every 2 hours as needed. Call for new or changing lesions.  Staying in the shade or wearing long sleeves, sun glasses (UVA+UVB protection) and wide brim hats (4-inch brim around the entire circumference of the hat) are also recommended for sun protection.    Recommend taking Heliocare sun protection supplement daily in sunny weather for additional sun protection. For maximum protection on the sunniest days, you can take up to 2 capsules of regular Heliocare OR take 1 capsule of Heliocare Ultra. For prolonged exposure (such as a full day in the sun), you can repeat your dose of the supplement 4 hours after your first dose. Heliocare can be purchased at [employer], at some [facility] or at [email or web address].    Due to recent changes in healthcare laws, you may see results of your pathology and/or laboratory studies on MyChart before the doctors have had a chance to review them. We understand that in some cases there may be results that are confusing or concerning to you. Please understand that not all results are received at the same time and often the doctors may need to interpret multiple results in order to provide you with the best plan of care or course of treatment. Therefore, we ask that you please give [place] 2 business days to thoroughly review all your results before contacting the office for clarification. Should we see a critical lab result, you will be contacted sooner.   If You Need Anything After Your Visit  If you have any questions or concerns for your doctor, please call our main line at [phone] and press option 4 to reach your doctor's medical assistant. If no one answers, please leave a voicemail as directed and  we will return your call as soon as possible. Messages left after 4 pm will be answered the following business day.   You may also send [place] a message via MyChart. We typically respond to MyChart messages within 1-2 business days.  For prescription refills, please ask your pharmacy to contact our office. Our fax number is [phone].  If you have an urgent issue when the clinic is closed that cannot wait until the next business day, you can page your doctor at the number below.    Please note that while we do our best to be available for urgent issues outside of office hours, we are not available 24/7.   If you have an urgent issue and are unable to reach [place], you may choose to seek medical care at your doctor's office, retail clinic, urgent care center, or emergency room.  If you have a medical emergency, please immediately call 911 or go to the emergency department.  Pager Numbers  - Dr. Gwen Pounds: [phone]  - Dr. Neale Burly: [phone]  - Dr. Roseanne Reno: [phone]  In the event of inclement weather, please call our main line at [phone] for an update on the status of any delays or closures.  Dermatology Medication Tips: Please keep the boxes that topical medications come in in order to help keep track of the instructions about where and how to use these. Pharmacies typically print the medication instructions only on the boxes and not directly on the medication tubes.   If your medication is too expensive, please contact our  office at [phone] option 4 or send [place] a message through MyChart.   We are unable to tell what your co-pay for medications will be in advance as this is different depending on your insurance coverage. However, we may be able to find a substitute medication at lower cost or fill out paperwork to get insurance to cover a needed medication.   If a prior authorization is required to get your medication covered by your insurance company, please allow [place] 1-2  business days to complete this process.  Drug prices often vary depending on where the prescription is filled and some pharmacies may offer cheaper prices.  The website www.goodrx.com contains coupons for medications through different pharmacies. The prices here do not account for what the cost may be with help from insurance (it may be cheaper with your insurance), but the website can give you the price if you did not use any insurance.  - You can print the associated coupon and take it with your prescription to the pharmacy.  - You may also stop by our office during regular business hours and pick up a GoodRx coupon card.  - If you need your prescription sent electronically to a different pharmacy, notify our office through [facility] or by phone at [phone] option 4.     Si Usted Necesita Algo Despus de Su Visita  Tambin puede enviarnos un mensaje a travs de Clinical cytogeneticist. Por lo general respondemos a los mensajes de MyChart en el transcurso de 1 a 2 das hbiles.  Para renovar recetas, por favor pida a su farmacia que se ponga en contacto con nuestra oficina. Annie Sable de fax es [place] [phone].  Si tiene un asunto urgente cuando la clnica est cerrada y que no puede esperar hasta el siguiente da hbil, puede llamar/localizar a su doctor(a) al nmero que aparece a continuacin.   Por favor, tenga en cuenta que aunque hacemos todo lo posible para estar disponibles para asuntos urgentes fuera del horario de [place], no estamos disponibles las 24 horas del da, los 7 [address]  [address] de la [place].   Si tiene un problema urgente y no puede comunicarse con nosotros, puede optar por buscar atencin mdica  en el consultorio de su doctor(a), en una clnica privada, en un centro de atencin urgente o en una sala de emergencias.  Si tiene Engineer, drilling, por favor llame inmediatamente al 911 o vaya a la sala de emergencias.  Nmeros de bper  - Dr. Gwen Pounds: [phone]  - Dra.  Moye: [phone]  - Dra. Roseanne Reno: [phone]  En caso de inclemencias del [place], por favor llame a Lacy Duverney principal al [phone] para una actualizacin sobre el [place] de cualquier retraso o cierre.  Consejos para la medicacin en dermatologa: Por favor, guarde las cajas en las que vienen los medicamentos de uso tpico para ayudarle a seguir las instrucciones sobre dnde y cmo usarlos. Las farmacias generalmente imprimen las instrucciones del medicamento slo en las cajas y no directamente en los tubos del [place].   Si su medicamento es muy caro, por favor, pngase en contacto con Rolm Gala llamando al [phone] y presione la opcin 4 o envenos un mensaje a travs de Clinical cytogeneticist.   No podemos decirle cul ser su copago por los medicamentos por adelantado ya que esto es diferente dependiendo de la cobertura de su seguro. Sin embargo, es posible que podamos encontrar un medicamento sustituto a Audiological scientist un formulario para que el seguro cubra el medicamento que  se considera necesario.   Si se requiere una autorizacin previa para que su compaa de seguros [place] su medicamento, por favor permtanos de 1 a 2 das hbiles para completar [address]  Los precios de los medicamentos varan con frecuencia dependiendo del Environmental consultant de dnde se surte la receta y alguna farmacias pueden ofrecer precios ms baratos.  El sitio web www.goodrx.com tiene cupones para medicamentos de Health and safety inspector. Los precios aqu no tienen en cuenta lo que podra costar con la ayuda del seguro (puede ser ms barato con su seguro), pero el sitio web puede darle el precio si no utiliz Tourist information centre manager.  - Puede imprimir el cupn correspondiente y llevarlo con su receta a la farmacia.  - Tambin puede pasar por nuestra oficina durante el horario de atencin regular y Education officer, museum una tarjeta de cupones de GoodRx.  - Si necesita que su receta se enve electrnicamente a una farmacia diferente,  informe a nuestra oficina a travs de MyChart de [place] o por telfono llamando al [phone] y presione la opcin 4.

## 2022-07-10 ENCOUNTER — Other Ambulatory Visit: Payer: Self-pay | Admitting: Internal Medicine

## 2022-07-10 DIAGNOSIS — I10 Essential (primary) hypertension: Secondary | ICD-10-CM

## 2022-07-30 ENCOUNTER — Ambulatory Visit
Admission: RE | Admit: 2022-07-30 | Discharge: 2022-07-30 | Disposition: A | Discharge status: Home / Self Care | Payer: Medicare HMO | Source: Ambulatory Visit | Attending: Internal Medicine | Admitting: Internal Medicine

## 2022-07-30 DIAGNOSIS — R441 Visual hallucinations: Secondary | ICD-10-CM | POA: Diagnosis not present

## 2022-07-30 DIAGNOSIS — I10 Essential (primary) hypertension: Secondary | ICD-10-CM | POA: Diagnosis present

## 2022-07-30 DIAGNOSIS — R413 Other amnesia: Secondary | ICD-10-CM | POA: Insufficient documentation

## 2022-07-30 DIAGNOSIS — I6782 Cerebral ischemia: Secondary | ICD-10-CM | POA: Diagnosis not present

## 2022-11-19 ENCOUNTER — Encounter: Payer: Self-pay | Admitting: Dermatology

## 2022-11-19 ENCOUNTER — Ambulatory Visit: Payer: Medicare HMO | Admitting: Dermatology

## 2022-11-19 VITALS — BP 158/63 | HR 53

## 2022-11-19 DIAGNOSIS — L578 Other skin changes due to chronic exposure to nonionizing radiation: Secondary | ICD-10-CM

## 2022-11-19 DIAGNOSIS — L821 Other seborrheic keratosis: Secondary | ICD-10-CM | POA: Diagnosis not present

## 2022-11-19 DIAGNOSIS — W908XXA Exposure to other nonionizing radiation, initial encounter: Secondary | ICD-10-CM | POA: Diagnosis not present

## 2022-11-19 DIAGNOSIS — L570 Actinic keratosis: Secondary | ICD-10-CM

## 2022-11-19 DIAGNOSIS — Z85828 Personal history of other malignant neoplasm of skin: Secondary | ICD-10-CM

## 2022-11-19 DIAGNOSIS — Z86007 Personal history of in-situ neoplasm of skin: Secondary | ICD-10-CM

## 2022-11-19 DIAGNOSIS — Z872 Personal history of diseases of the skin and subcutaneous tissue: Secondary | ICD-10-CM

## 2022-11-19 NOTE — Patient Instructions (Signed)

## 2022-11-19 NOTE — Progress Notes (Signed)
Follow-Up Visit   Subjective  Haley Rollins is a 89 y.o. female who presents for the following: follow-up Aks of the left and right pretibia. Biopsy proven on the right pretibia (hypertrophic). 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  Patient accompanied by caregiver who contributes to history.   The following portions of the chart were reviewed this encounter and updated as appropriate: medications, allergies, medical history  Review of Systems:  No other skin or systemic complaints except as noted in HPI or Assessment and Plan.  Objective  Well appearing patient in no apparent distress; mood and affect are within normal limits.  A focused examination was performed of the following areas: Face, legs  Relevant physical exam findings are noted in the Assessment and Plan.  Right Medial Cheek x 2 Pink scaly macules.     Assessment & Plan   AK (actinic keratosis) Right Medial Cheek x 2  Actinic keratoses are precancerous spots that appear secondary to cumulative UV radiation exposure/sun exposure over time. They are chronic with expected duration over 1 year. A portion of actinic keratoses will progress to squamous cell carcinoma of the skin. It is not possible to reliably predict which spots will progress to skin cancer and so treatment is recommended to prevent development of skin cancer.  Recommend daily broad spectrum sunscreen SPF 30+ to sun-exposed areas, reapply every 2 hours as needed.  Recommend staying in the shade or wearing long sleeves, sun glasses (UVA+UVB protection) and wide brim hats (4-inch brim around the entire circumference of the hat). Call for new or changing lesions.  Destruction of lesion - Right Medial Cheek x 2  Destruction method: cryotherapy   Informed consent: discussed and consent  obtained   Lesion destroyed using liquid nitrogen: Yes   Region frozen until ice ball extended beyond lesion: Yes   Outcome: patient tolerated procedure well with no complications   Post-procedure details: wound care instructions given   Additional details:  Prior to procedure, discussed risks of blister formation, small wound, skin dyspigmentation, or rare scar following cryotherapy. Recommend Vaseline ointment to treated areas while healing.   ACTINIC DAMAGE - chronic, secondary to cumulative UV radiation exposure/sun exposure over time - diffuse scaly erythematous macules with underlying dyspigmentation - Recommend daily broad spectrum sunscreen SPF 30+ to sun-exposed areas, reapply every 2 hours as needed.  - Recommend staying in the shade or wearing long sleeves, sun glasses (UVA+UVB protection) and wide brim hats (4-inch brim around the entire circumference of the hat). - Call for new or changing lesions.  HISTORY OF PRECANCEROUS ACTINIC KERATOSIS - site(s) of PreCancerous Actinic Keratosis clear today. R pretibia - these may recur and new lesions may form requiring treatment to prevent transformation into skin cancer - observe for new or changing spots and contact [facility] for appointment if occur - photoprotection with sun protective clothing; sunglasses and broad spectrum sunscreen with SPF of at least 30 + and frequent self skin exams recommended - yearly exams by a dermatologist recommended for persons with history of PreCancerous Actinic Keratoses  SEBORRHEIC KERATOSIS - Stuck-on, waxy, tan-brown papules and/or plaques  - Benign-appearing - Discussed benign etiology and prognosis. - Observe - Call for any changes  HISTORY OF SQUAMOUS CELL CARCINOMA OF THE SKIN Left cheek, Mohs 05/28/21 - No  evidence of recurrence today - Recommend regular full body skin exams - Recommend daily broad spectrum sunscreen SPF 30+ to sun-exposed areas, reapply every 2 hours as  needed.  - Call if any new or changing lesions are noted between office visits  HISTORY OF SQUAMOUS CELL CARCINOMA IN SITU OF THE SKIN - Right preauricular, EDC 11/2021 - No evidence of recurrence today - Recommend regular full body skin exams - Recommend daily broad spectrum sunscreen SPF 30+ to sun-exposed areas, reapply every 2 hours as needed.  - Call if any new or changing lesions are noted between office visits   Return as scheduled, for TBSE, Hx SCC, Hx AKs.  ICherlyn Labella, CMA, am acting as scribe for Willeen Niece, MD .   Documentation: I have reviewed the above documentation for accuracy and completeness, and I agree with the above.  Willeen Niece, MD

## 2023-04-12 ENCOUNTER — Inpatient Hospital Stay
Admission: EM | Admit: 2023-04-12 | Discharge: 2023-04-15 | DRG: 391 | Disposition: A | Discharge status: Home Health | Payer: Medicare HMO | Attending: Internal Medicine | Admitting: Internal Medicine

## 2023-04-12 ENCOUNTER — Emergency Department: Payer: Medicare HMO

## 2023-04-12 ENCOUNTER — Encounter: Payer: Self-pay | Admitting: Internal Medicine

## 2023-04-12 ENCOUNTER — Other Ambulatory Visit: Payer: Self-pay

## 2023-04-12 DIAGNOSIS — Z7902 Long term (current) use of antithrombotics/antiplatelets: Secondary | ICD-10-CM | POA: Diagnosis not present

## 2023-04-12 DIAGNOSIS — R1900 Intra-abdominal and pelvic swelling, mass and lump, unspecified site: Secondary | ICD-10-CM

## 2023-04-12 DIAGNOSIS — J9601 Acute respiratory failure with hypoxia: Secondary | ICD-10-CM | POA: Diagnosis present

## 2023-04-12 DIAGNOSIS — K5792 Diverticulitis of intestine, part unspecified, without perforation or abscess without bleeding: Secondary | ICD-10-CM | POA: Diagnosis not present

## 2023-04-12 DIAGNOSIS — I10 Essential (primary) hypertension: Secondary | ICD-10-CM | POA: Diagnosis present

## 2023-04-12 DIAGNOSIS — K5732 Diverticulitis of large intestine without perforation or abscess without bleeding: Principal | ICD-10-CM | POA: Diagnosis present

## 2023-04-12 DIAGNOSIS — F32A Depression, unspecified: Secondary | ICD-10-CM | POA: Diagnosis present

## 2023-04-12 DIAGNOSIS — Z856 Personal history of leukemia: Secondary | ICD-10-CM

## 2023-04-12 DIAGNOSIS — R1030 Lower abdominal pain, unspecified: Principal | ICD-10-CM

## 2023-04-12 DIAGNOSIS — E0590 Thyrotoxicosis, unspecified without thyrotoxic crisis or storm: Secondary | ICD-10-CM | POA: Diagnosis present

## 2023-04-12 DIAGNOSIS — I2510 Atherosclerotic heart disease of native coronary artery without angina pectoris: Secondary | ICD-10-CM | POA: Diagnosis present

## 2023-04-12 DIAGNOSIS — Z87891 Personal history of nicotine dependence: Secondary | ICD-10-CM

## 2023-04-12 DIAGNOSIS — Z86008 Personal history of in-situ neoplasm of other site: Secondary | ICD-10-CM

## 2023-04-12 DIAGNOSIS — E876 Hypokalemia: Secondary | ICD-10-CM | POA: Diagnosis present

## 2023-04-12 DIAGNOSIS — R17 Unspecified jaundice: Secondary | ICD-10-CM | POA: Diagnosis present

## 2023-04-12 DIAGNOSIS — Z8673 Personal history of transient ischemic attack (TIA), and cerebral infarction without residual deficits: Secondary | ICD-10-CM | POA: Diagnosis not present

## 2023-04-12 DIAGNOSIS — R109 Unspecified abdominal pain: Secondary | ICD-10-CM | POA: Diagnosis present

## 2023-04-12 DIAGNOSIS — E039 Hypothyroidism, unspecified: Secondary | ICD-10-CM | POA: Diagnosis present

## 2023-04-12 DIAGNOSIS — Z79899 Other long term (current) drug therapy: Secondary | ICD-10-CM

## 2023-04-12 DIAGNOSIS — Z9049 Acquired absence of other specified parts of digestive tract: Secondary | ICD-10-CM | POA: Diagnosis not present

## 2023-04-12 DIAGNOSIS — R0902 Hypoxemia: Secondary | ICD-10-CM

## 2023-04-12 DIAGNOSIS — R1909 Other intra-abdominal and pelvic swelling, mass and lump: Secondary | ICD-10-CM | POA: Diagnosis present

## 2023-04-12 HISTORY — DX: Intra-abdominal and pelvic swelling, mass and lump, unspecified site: R19.00

## 2023-04-12 LAB — CBC WITH DIFFERENTIAL/PLATELET
Abs Immature Granulocytes: 0.03 10*3/uL (ref 0.00–0.07)
Basophils Absolute: 0 10*3/uL (ref 0.0–0.1)
Basophils Relative: 0 %
Eosinophils Absolute: 0 10*3/uL (ref 0.0–0.5)
Eosinophils Relative: 0 %
HCT: 34.3 % — ABNORMAL LOW (ref 36.0–46.0)
Hemoglobin: 11.4 g/dL — ABNORMAL LOW (ref 12.0–15.0)
Immature Granulocytes: 0 %
Lymphocytes Relative: 28 %
Lymphs Abs: 2.9 10*3/uL (ref 0.7–4.0)
MCH: 32.5 pg (ref 26.0–34.0)
MCHC: 33.2 g/dL (ref 30.0–36.0)
MCV: 97.7 fL (ref 80.0–100.0)
Monocytes Absolute: 0.3 10*3/uL (ref 0.1–1.0)
Monocytes Relative: 3 %
Neutro Abs: 6.9 10*3/uL (ref 1.7–7.7)
Neutrophils Relative %: 69 %
Platelets: 177 10*3/uL (ref 150–400)
RBC: 3.51 MIL/uL — ABNORMAL LOW (ref 3.87–5.11)
RDW: 14.6 % (ref 11.5–15.5)
WBC: 10.2 10*3/uL (ref 4.0–10.5)
nRBC: 0 % (ref 0.0–0.2)

## 2023-04-12 LAB — URINALYSIS, ROUTINE W REFLEX MICROSCOPIC
Bilirubin Urine: NEGATIVE
Glucose, UA: NEGATIVE mg/dL
Hgb urine dipstick: NEGATIVE
Ketones, ur: NEGATIVE mg/dL
Leukocytes,Ua: NEGATIVE
Nitrite: NEGATIVE
Protein, ur: NEGATIVE mg/dL
Specific Gravity, Urine: 1.024 (ref 1.005–1.030)
pH: 5 (ref 5.0–8.0)

## 2023-04-12 LAB — LIPASE, BLOOD: Lipase: 28 U/L (ref 11–51)

## 2023-04-12 LAB — COMPREHENSIVE METABOLIC PANEL
ALT: 36 U/L (ref 0–44)
AST: 61 U/L — ABNORMAL HIGH (ref 15–41)
Albumin: 3 g/dL — ABNORMAL LOW (ref 3.5–5.0)
Alkaline Phosphatase: 52 U/L (ref 38–126)
Anion gap: 10 (ref 5–15)
BUN: 21 mg/dL (ref 8–23)
CO2: 21 mmol/L — ABNORMAL LOW (ref 22–32)
Calcium: 8.1 mg/dL — ABNORMAL LOW (ref 8.9–10.3)
Chloride: 105 mmol/L (ref 98–111)
Creatinine, Ser: 0.78 mg/dL (ref 0.44–1.00)
GFR, Estimated: >60 mL/min (ref >60)
Glucose, Bld: 115 mg/dL — ABNORMAL HIGH (ref 70–99)
Potassium: 3.2 mmol/L — ABNORMAL LOW (ref 3.5–5.1)
Sodium: 136 mmol/L (ref 135–145)
Total Bilirubin: 2.4 mg/dL — ABNORMAL HIGH (ref 0.0–1.2)
Total Protein: 5.1 g/dL — ABNORMAL LOW (ref 6.5–8.1)

## 2023-04-12 LAB — TROPONIN I (HIGH SENSITIVITY)
Troponin I (High Sensitivity): 37 ng/L — ABNORMAL HIGH (ref <18)
Troponin I (High Sensitivity): 36 ng/L — ABNORMAL HIGH (ref <18)

## 2023-04-12 LAB — MAGNESIUM: Magnesium: 1.9 mg/dL (ref 1.7–2.4)

## 2023-04-12 MED ORDER — LOSARTAN POTASSIUM 25 MG PO TABS: 25.0000 mg | ORAL_TABLET | Freq: Every evening | ORAL | Status: DC

## 2023-04-12 MED ORDER — HYDROMORPHONE HCL 1 MG/ML IJ SOLN
0.5000 mg | INTRAMUSCULAR | Status: DC | PRN
  Administered 2023-04-12 – 2023-04-13 (×2): 0.5 mg via INTRAVENOUS
  Filled 2023-04-12 (×2): qty 0.5

## 2023-04-12 MED ORDER — POTASSIUM CHLORIDE 10 MEQ/100ML IV SOLN
10.0000 meq | Freq: Once | INTRAVENOUS | Status: AC
  Administered 2023-04-12: 10 meq via INTRAVENOUS
  Filled 2023-04-12: qty 100

## 2023-04-12 MED ORDER — RISAQUAD PO CAPS
1.0000 | ORAL_CAPSULE | Freq: Every day | ORAL | Status: DC
  Administered 2023-04-12 – 2023-04-15 (×4): 1 via ORAL
  Filled 2023-04-12 (×4): qty 1

## 2023-04-12 MED ORDER — ATORVASTATIN CALCIUM 20 MG PO TABS
40.0000 mg | ORAL_TABLET | Freq: Every day | ORAL | Status: DC
  Administered 2023-04-12 – 2023-04-13 (×2): 40 mg via ORAL
  Filled 2023-04-12 (×2): qty 2

## 2023-04-12 MED ORDER — CITALOPRAM HYDROBROMIDE 20 MG PO TABS
20.0000 mg | ORAL_TABLET | Freq: Every day | ORAL | Status: DC
  Administered 2023-04-12 – 2023-04-15 (×4): 20 mg via ORAL
  Filled 2023-04-12 (×4): qty 1

## 2023-04-12 MED ORDER — ONDANSETRON HCL 4 MG/2ML IJ SOLN
4.0000 mg | Freq: Once | INTRAMUSCULAR | Status: AC
  Administered 2023-04-12: 4 mg via INTRAVENOUS
  Filled 2023-04-12: qty 2

## 2023-04-12 MED ORDER — ONDANSETRON HCL 4 MG/2ML IJ SOLN: 4.0000 mg | Freq: Four times a day (QID) | INTRAMUSCULAR | Status: DC | PRN

## 2023-04-12 MED ORDER — PIPERACILLIN-TAZOBACTAM 3.375 G IVPB
3.3750 g | Freq: Three times a day (TID) | INTRAVENOUS | Status: DC
Start: 2023-04-12 — End: 2023-04-15
  Administered 2023-04-12 – 2023-04-15 (×9): 3.375 g via INTRAVENOUS
  Filled 2023-04-12 (×9): qty 50

## 2023-04-12 MED ORDER — POTASSIUM CHLORIDE CRYS ER 20 MEQ PO TBCR
40.0000 meq | EXTENDED_RELEASE_TABLET | Freq: Once | ORAL | Status: AC
  Administered 2023-04-12: 40 meq via ORAL
  Filled 2023-04-12: qty 2

## 2023-04-12 MED ORDER — METHIMAZOLE 2.5 MG HALF TABLET
2.5000 mg | ORAL_TABLET | Freq: Every day | ORAL | Status: DC
  Administered 2023-04-12 – 2023-04-15 (×4): 2.5 mg via ORAL
  Filled 2023-04-12 (×5): qty 1

## 2023-04-12 MED ORDER — SODIUM CHLORIDE 0.9 % IV BOLUS
1000.0000 mL | Freq: Once | INTRAVENOUS | Status: AC
  Administered 2023-04-12: 1000 mL via INTRAVENOUS

## 2023-04-12 MED ORDER — HYDRALAZINE HCL 20 MG/ML IJ SOLN: 5.0000 mg | Freq: Four times a day (QID) | INTRAMUSCULAR | Status: DC | PRN

## 2023-04-12 MED ORDER — CLOPIDOGREL BISULFATE 75 MG PO TABS
75.0000 mg | ORAL_TABLET | Freq: Every day | ORAL | Status: DC
  Administered 2023-04-12 – 2023-04-15 (×4): 75 mg via ORAL
  Filled 2023-04-12 (×4): qty 1

## 2023-04-12 MED ORDER — MORPHINE SULFATE (PF) 2 MG/ML IV SOLN
2.0000 mg | Freq: Once | INTRAVENOUS | Status: AC
  Administered 2023-04-12: 2 mg via INTRAVENOUS
  Filled 2023-04-12: qty 1

## 2023-04-12 MED ORDER — HYDROMORPHONE HCL 2 MG PO TABS
1.0000 mg | ORAL_TABLET | Freq: Four times a day (QID) | ORAL | Status: DC | PRN
  Administered 2023-04-12: 1 mg via ORAL
  Filled 2023-04-12: qty 1

## 2023-04-12 MED ORDER — PIPERACILLIN-TAZOBACTAM 3.375 G IVPB 30 MIN
3.3750 g | Freq: Once | INTRAVENOUS | Status: AC
  Administered 2023-04-12: 3.375 g via INTRAVENOUS
  Filled 2023-04-12 (×2): qty 50

## 2023-04-12 MED ORDER — SODIUM CHLORIDE 0.9 % IV SOLN: INTRAVENOUS | Status: DC

## 2023-04-12 MED ORDER — ENOXAPARIN SODIUM 40 MG/0.4ML IJ SOSY
40.0000 mg | PREFILLED_SYRINGE | INTRAMUSCULAR | Status: DC
  Administered 2023-04-12 – 2023-04-15 (×4): 40 mg via SUBCUTANEOUS
  Filled 2023-04-12 (×4): qty 0.4

## 2023-04-12 MED ORDER — IOHEXOL 300 MG/ML  SOLN
100.0000 mL | Freq: Once | INTRAMUSCULAR | Status: AC | PRN
  Administered 2023-04-12: 100 mL via INTRAVENOUS

## 2023-04-12 NOTE — ED Provider Notes (Signed)
 [facility] Provider Note    Event Date/Time   First MD Initiated Contact with Patient 04/12/23 [phone]     (approximate)   History   Abdominal Pain   HPI  Haley Rollins is a 89 y.o. female   brought to the ED via EMS from home with a 2-day history of lower abdominal pain.  Last BM 2 days ago which is normal for patient.  Denies associated fever/chills, chest pain, shortness of breath, nausea, vomiting, dysuria or diarrhea.  Denies recent travel, trauma, camping or antibiotic use.     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 SCCIS. EDC 11/20/2021   TIA (transient ischemic attack)      Active Problem List   Patient Active Problem List   Diagnosis Date Noted   CLL (chronic lymphocytic leukemia) (HCC) 03/02/2020   Postural dizziness with presyncope 04/09/2019   Sinus bradycardia 04/09/2019   History of TIA (transient ischemic attack) 04/09/2019   Essential hypertension 04/09/2019   Hyperthyroidism 04/09/2019   Symptomatic bradycardia 04/09/2019   AKI (acute kidney injury) (HCC) 04/09/2019   TIA (transient ischemic attack) 01/24/2015     Past Surgical History   Past Surgical History:  Procedure Laterality Date   BRAIN SURGERY       Home Medications   Prior to Admission medications   Medication Sig Start Date End Date Taking? Authorizing Provider  atorvastatin (LIPITOR) 40 MG tablet Take 1 tablet (40 mg total) by mouth daily at 6 PM. 01/26/15   Sainani, Rolly Pancake, MD  Calcium Carbonate-Vitamin D (CALCIUM 600+D) 600-400 MG-UNIT tablet Take 1 tablet by mouth daily.     [provider]  citalopram (CELEXA) 20 MG tablet Take 20 mg by mouth daily.    [provider]  clopidogrel (PLAVIX) 75  MG tablet Take 1 tablet (75 mg total) by mouth daily. 01/26/15   Houston Siren, MD  lisinopril (PRINIVIL,ZESTRIL) 5 MG tablet Take 1 tablet (5 mg total) by mouth daily. 01/26/15   Houston Siren, MD  meclizine (ANTIVERT) 25 MG tablet Take 1 tablet (25 mg total) by mouth 3 (three) times daily as needed for dizziness or nausea. 02/06/15   Jennye Moccasin, MD  methimazole (TAPAZOLE) 5 MG tablet Take 2.5 mg by mouth daily.    [provider]  Multiple Vitamin (MULTIVITAMIN WITH MINERALS) TABS tablet Take 1 tablet by mouth daily.    [provider]  nitroGLYCERIN (NITROSTAT) 0.4 MG SL tablet Place 0.4 mg under the tongue every 5 (five) minutes as needed for chest pain.    [provider]     Allergies  Sulfa antibiotics   Family History   Family History  Problem Relation Age of Onset   Diabetes Neg Hx    Breast cancer Neg Hx      Physical Exam  Triage Vital Signs: ED Triage Vitals  Encounter Vitals Group     BP 04/12/23 0302 126/80     Systolic BP Percentile --      Diastolic BP Percentile --      Pulse Rate 04/12/23 0302 66     Resp 04/12/23 0302 19     Temp 04/12/23 0302 98 F (36.7 C)     Temp  Source 04/12/23 0302 Oral     SpO2 04/12/23 0302 99 %     Weight 04/12/23 0300 165 lb (74.8 kg)     Height 04/12/23 0300 5\' 5"  (1.651 m)     Head Circumference --      Peak Flow --      Pain Score 04/12/23 0259 8     Pain Loc --      Pain Education --      Exclude from Growth Chart --     Updated Vital Signs: BP 126/80 (BP Location: Left Arm)   Pulse 66   Temp 98 F (36.7 C) (Oral)   Resp 19   Ht 5\' 5"  (1.651 m)   Wt 74.8 kg   SpO2 99%   BMI 27.46 kg/m    General: Awake, mild distress.  CV:  RRR.  Good peripheral perfusion. Resp:  Normal effort.  CTAB. Abd:  Lower abdominal tenderness to palpation, left greater than right.  No rebound or guarding.  No distention.  Other:  No truncal vesicles.   ED Results / Procedures / Treatments   Labs (all labs ordered are listed, but only abnormal results are displayed) Labs Reviewed  COMPREHENSIVE METABOLIC PANEL - Abnormal; Notable for the following components:      Result Value   Potassium 3.2 (*)    CO2 21 (*)    Glucose, Bld 115 (*)    Calcium 8.1 (*)    Total Protein 5.1 (*)    Albumin 3.0 (*)    AST 61 (*)    Total Bilirubin 2.4 (*)    All other components within normal limits  URINALYSIS, ROUTINE W REFLEX MICROSCOPIC - Abnormal; Notable for the following components:   Color, Urine AMBER (*)    APPearance HAZY (*)    All other components within normal limits  CBC WITH DIFFERENTIAL/PLATELET - Abnormal; Notable for the following components:   RBC 3.51 (*)    Hemoglobin 11.4 (*)    HCT 34.3 (*)    All other components within normal limits  TROPONIN I (HIGH SENSITIVITY) - Abnormal; Notable for the following components:   Troponin I (High Sensitivity) 37 (*)    All other components within normal limits  LIPASE, BLOOD  TROPONIN I (HIGH SENSITIVITY)     EKG  ED ECG REPORT I, Murl Golladay J, the attending physician, personally viewed and interpreted this ECG.   Date: 04/12/2023  EKG Time: 0339  Rate: 60  Rhythm: normal sinus rhythm  Axis: Normal  Intervals:none  ST&T Change: Nonspecific    RADIOLOGY I have independently visualized and interpreted patient's imaging study as well as noted the radiology interpretation:  CT abdomen/pelvis: Diverticulitis/colitis, pelvic cystic mass recommend follow-up pelvic ultrasound in 6 to 12 months  Official radiology report(s): CT ABDOMEN PELVIS W CONTRAST Result Date: 04/12/2023 CLINICAL DATA:  89 year old female with left lower quadrant abdominal pain for 3 days. EXAM: CT ABDOMEN AND PELVIS WITH CONTRAST TECHNIQUE: Multidetector CT imaging of the abdomen and pelvis was performed using the standard protocol following bolus administration of intravenous contrast. RADIATION DOSE REDUCTION: This exam was performed according  to the departmental dose-optimization program which includes automated exposure control, adjustment of the mA and/or kV according to patient size and/or use of iterative reconstruction technique. CONTRAST:  OMNIPAQUE IOHEXOL 300 MG/ML  SOLN COMPARISON:  None Available. FINDINGS: Lower chest: Moderate to large gastric hiatal hernia, mostly intrathoracic stomach is demonstrated. Superimposed mild cardiomegaly. No pericardial effusion. Tortuous descending thoracic aorta. Confluent and  enhancing atelectasis in the medial basal segment of the left lower lobe. Trace superimposed left pleural effusion. Mild right lung base atelectasis. Hepatobiliary: Cholecystectomy.  Negative liver. Pancreas: Partially atrophied. Spleen: Negative. Adrenals/Urinary Tract: Normal adrenal glands. Normal kidneys. Symmetric renal enhancement and contrast excretion. Decompressed ureters. Decompressed urinary bladder. Stomach/Bowel: Diverticulosis in the distal descending colon and throughout much of the sigmoid. Roughly 8 cm segment of abnormal distal large bowel wall thickening, inflammation, indistinct appearance in the left lower quadrant (coronal image 27) with underlying diverticula. Mesenteric stranding and spiculation. The inflammation abates by the level of the bladder dome. No extraluminal gas or fluid is identified. The upstream descending colon is nondilated. Negative transverse colon, negative right colon. Diminutive or absent appendix is not identified. Nondilated small bowel. Mild secondary inflammation of left lower abdominal small bowel loops adjacent to the abnormal colon. Largely intrathoracic stomach. Gastric antrum remains within the abdomen. Decompressed duodenum. No free air or free fluid. Vascular/Lymphatic: Extensive Aortoiliac calcified atherosclerosis. Major arterial structures in the abdomen and pelvis remain patent. Portal venous system is patent. No lymphadenopathy. Reproductive: Surgically absent uterus. But  large 8.1 cm cystic mass in the central pelvis inseparable from the vaginal cough, simple fluid density and mildly thickened appearance of adjacent adnexal tissue (series 2, image 71). No inflammation associated. Other: No pelvis free fluid. Musculoskeletal: Grade 1-grade 2 spondylolisthesis in the lower lumbar spine with advanced facet degeneration. No acute or suspicious osseous lesion identified. IMPRESSION: 1. Abnormal 8 cm segment of the distal large bowel at the junction of the descending and sigmoid colon. Favor Acute Diverticulitis/Colitis but recommend ensuring colon cancer screening is up-to-date as adenocarcinoma can sometimes have a similar appearance and presentation. No abscess or complicating features. 2. Positive also for a Large 8.1 cm cystic mass in the central pelvis, inseparable from the vaginal cuff and adnexa. Recommend follow-up pelvic ultrasound in 6-12 months. Reference: JACR 2020 Feb;17(2):248-254. 3. Large gastric hiatal hernia, mostly intrathoracic stomach. Lung base atelectasis. Trace pleural effusion. 4.  Aortic Atherosclerosis (ICD10-I70.0).  Cardiomegaly. Electronically Signed   By: Odessa Fleming M.D.   On: 04/12/2023 04:46     PROCEDURES:  Critical Care performed: Yes, see critical care procedure note(s)  CRITICAL CARE Performed by: Irean Hong   Total critical care time: 30 minutes  Critical care time was exclusive of separately billable procedures and treating other patients.  Critical care was necessary to treat or prevent imminent or life-threatening deterioration.  Critical care was time spent personally by me on the following activities: development of treatment plan with patient and/or surrogate as well as nursing, discussions with consultants, evaluation of patient's response to treatment, examination of patient, obtaining history from patient or surrogate, ordering and performing treatments and interventions, ordering and review of laboratory studies, ordering  and review of radiographic studies, pulse oximetry and re-evaluation of patient's condition.   Marland Kitchen1-3 Lead EKG Interpretation  Performed by: Irean Hong, MD Authorized by: Irean Hong, MD     Interpretation: normal     ECG rate:  65   ECG rate assessment: normal     Rhythm: sinus rhythm     Ectopy: none     Conduction: normal   Comments:     Patient placed on cardiac monitor to evaluate for arrhythmias    MEDICATIONS ORDERED IN ED: Medications  piperacillin-tazobactam (ZOSYN) IVPB 3.375 g (has no administration in time range)  potassium chloride 10 mEq in 100 mL IVPB (has no administration in time range)  sodium chloride  0.9 % bolus 1,000 mL (0 mLs Intravenous Stopped 04/12/23 0502)  ondansetron (ZOFRAN) injection 4 mg (4 mg Intravenous Given 04/12/23 0349)  morphine (PF) 2 MG/ML injection 2 mg (2 mg Intravenous Given 04/12/23 0350)  iohexol (OMNIPAQUE) 300 MG/ML solution 100 mL (100 mLs Intravenous Contrast Given 04/12/23 0400)     IMPRESSION / MDM / ASSESSMENT AND PLAN / ED COURSE  I reviewed the triage vital signs and the nursing notes.                             89 year old female presenting with lower abdominal pain. Differential diagnosis includes, but is not limited to, ovarian cyst, ovarian torsion, acute appendicitis, diverticulitis, urinary tract infection/pyelonephritis, endometriosis, bowel obstruction, colitis, renal colic, gastroenteritis, hernia, fibroids, etc. I personally viewed patient's records and note a PCP office visit on 02/07/2023 for follow-up hypertension, CLL, CAD.  Patient's presentation is most consistent with acute complicated illness / injury requiring diagnostic workup.  The patient is on the cardiac monitor to evaluate for evidence of arrhythmia and/or significant heart rate changes.  Will obtain lab work, UA, CT abdomen/pelvis.  Keep NPO, initiate IV fluid hydration, IV Morphine for pain, IV Zofran to prevent nausea.  Will reassess.  Clinical  Course as of 04/12/23 0503  Sat Apr 12, 2023  0502 Room air saturation 84%, nasal cannula oxygen applied.  Updated patient and family member of all laboratory and imaging results.  Will start IV Zosyn, replete potassium via IV.  Obtain chest x-ray for hypoxia although that is most likely after administration of morphine.  Will discuss with hospitalist services for evaluation and admission. [JS]    Clinical Course User Index [JS] Irean Hong, MD     FINAL CLINICAL IMPRESSION(S) / ED DIAGNOSES   Final diagnoses:  Lower abdominal pain  Hypokalemia  Diverticulitis  Hypoxia     Rx / DC Orders   ED Discharge Orders     None        Note:  This document was prepared using Dragon voice recognition software and may include unintentional dictation errors.   Irean Hong, MD 04/12/23 [phone]

## 2023-04-12 NOTE — ED Triage Notes (Signed)
 BIB ems for lower abd pain that has been going on since Thursday.   Per pt "it was hurting so much yesterday I didn't really get out of bed much and I just couldn't stand it anymore so had to call."  Denies n/v/d  NADN

## 2023-04-12 NOTE — H&P (Signed)
 History and Physical    Haley Rollins:[id number] DOB: 1931/02/27 DOA: 04/12/2023  PCP: Lynnea Ferrier, MD (Confirm with patient/family/NH records and if not entered, this has to be entered at [facility] point of entry) Patient coming from: Home  I have personally briefly reviewed patient's old medical records in [facility]  Chief Complaint: Belly hurts  HPI: Haley Rollins is a 89 y.o. female with medical history significant of HTN, hypothyroidism, TIA on Plavix, CLL, presented with worsening of new onset abdominal pain.  Symptoms started 2 days ago, patient became constipated and started to have severe left-sided abdominal pain, cramping-like intermittent associated feeling of bowel movement but has been remained constipated for the last 2 days.  Has had subjective fever but no chills.  She denied any such pain before.  She also felt nauseous but no vomiting.  Denied any urinary problems no chest pain or shortness of breath.  In the ED, patient was found afebrile, nontachycardic blood pressure 120/80 O2 saturation 99% on room air.  Blood work showed WBC 10.2, creatinine 0.7 BUN 21, K3.2, CT abdomen pelvis showed acute diverticulitis in the junction of descending and sigmoid colon about 8 cm segment, incidental finding of large 8.1 cm cystic mass in the central pelvis inseparable from vaginal cuff and adnexa.  Patient was given Zosyn and IV morphine.  Briefly patient became lethargic and hypoxic, but resolved soon after.  Review of Systems: As per HPI otherwise 1 point review of systems negative.    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 SCCIS. [facility] 11/20/2021   TIA (transient ischemic attack)     Past Surgical History:  Procedure Laterality Date   BRAIN  SURGERY       reports that she has quit smoking. Her smoking use included cigarettes. She has never used smokeless tobacco. She reports current alcohol use of about 1.0 - 2.0 standard drink of alcohol per week. She reports that she does not use drugs.  Allergies  Allergen Reactions   Sulfa Antibiotics Hives    Family History  Problem Relation Age of Onset   Diabetes Neg Hx    Breast cancer Neg Hx      Prior to Admission medications   Medication Sig Start Date End Date Taking? Authorizing Provider  atorvastatin (LIPITOR) 40 MG tablet Take 1 tablet (40 mg total) by mouth daily at 6 PM. 01/26/15  Yes Sainani, Rolly Pancake, MD  Calcium Carbonate-Vitamin D (CALCIUM 600+D) 600-400 MG-UNIT tablet Take 1 tablet by mouth daily.    Yes [provider]  citalopram (CELEXA) 20 MG tablet Take 20 mg by mouth daily.   Yes [provider]  clopidogrel (PLAVIX) 75 MG tablet Take 1 tablet (75 mg total) by mouth daily. 01/26/15  Yes Houston Siren, MD  Cranberry 450 MG TABS Take 900 mg by mouth daily.   Yes [provider]  losartan (COZAAR) 25 MG tablet Take 25 mg by mouth every evening. 08/19/22  Yes [provider]  methimazole (TAPAZOLE) 5 MG tablet Take 2.5 mg by mouth daily.   Yes [provider]  Multiple Vitamin (MULTIVITAMIN WITH MINERALS) TABS tablet Take 1 tablet by mouth daily.   Yes [provider]  nitroGLYCERIN (NITROSTAT) 0.4 MG SL tablet Place 0.4  mg under the tongue every 5 (five) minutes as needed for chest pain.   Yes [provider]  lisinopril (PRINIVIL,ZESTRIL) 5 MG tablet Take 1 tablet (5 mg total) by mouth daily. Patient not taking: Reported on 04/12/2023 01/26/15   Houston Siren, MD  meclizine (ANTIVERT) 25 MG tablet Take 1 tablet (25 mg total) by mouth 3 (three) times daily as needed for dizziness or nausea. Patient not taking: Reported on 04/12/2023 02/06/15   Jennye Moccasin, MD    Physical Exam: Vitals:   04/12/23  0600 04/12/23 0736 04/12/23 0759 04/12/23 0818  BP: (!) 120/53 (!) 113/49 (!) 136/52 (!) 158/84  Pulse: 64 68 75 93  Resp:  (!) 22 16 (!) 22  Temp:  98.5 F (36.9 C) (!) 97.5 F (36.4 C) 98 F (36.7 C)  TempSrc:  Oral Oral Oral  SpO2: 91% (!) 2% 97% 93%  Weight:      Height:        Constitutional: NAD, calm, comfortable Vitals:   04/12/23 0600 04/12/23 0736 04/12/23 0759 04/12/23 0818  BP: (!) 120/53 (!) 113/49 (!) 136/52 (!) 158/84  Pulse: 64 68 75 93  Resp:  (!) 22 16 (!) 22  Temp:  98.5 F (36.9 C) (!) 97.5 F (36.4 C) 98 F (36.7 C)  TempSrc:  Oral Oral Oral  SpO2: 91% (!) 2% 97% 93%  Weight:      Height:       Eyes: PERRL, lids and conjunctivae normal ENMT: Mucous membranes are dry. Posterior pharynx clear of any exudate or lesions.Normal dentition.  Neck: normal, supple, no masses, no thyromegaly Respiratory: clear to auscultation bilaterally, no wheezing, no crackles. Normal respiratory effort. No accessory muscle use.  Cardiovascular: Regular rate and rhythm, no murmurs / rubs / gallops. No extremity edema. 2+ pedal pulses. No carotid bruits.  Abdomen: Tenderness on left aspect of abdomen, no rebound no guarding, no masses palpated. No hepatosplenomegaly. Bowel sounds positive.  Musculoskeletal: no clubbing / cyanosis. No joint deformity upper and lower extremities. Good ROM, no contractures. Normal muscle tone.  Skin: no rashes, lesions, ulcers. No induration Neurologic: CN 2-12 grossly intact. Sensation intact, DTR normal. Strength 5/5 in all 4.  Psychiatric: Normal judgment and insight. Alert and oriented x 3. Normal mood.    Labs on Admission: I have personally reviewed following labs and imaging studies  CBC: Recent Labs  Lab 04/12/23 0315  WBC 10.2  NEUTROABS 6.9  HGB 11.4*  HCT 34.3*  MCV 97.7  PLT 177   Basic Metabolic Panel: Recent Labs  Lab 04/12/23 0315 04/12/23 0509  NA 136  --   K 3.2*  --   CL 105  --   CO2 21*  --   GLUCOSE 115*   --   BUN 21  --   CREATININE 0.78  --   CALCIUM 8.1*  --   MG  --  1.9   GFR: Estimated Creatinine Clearance: 48.2 mL/min (by C-G formula based on SCr of 0.78 mg/dL). Liver Function Tests: Recent Labs  Lab 04/12/23 0315  AST 61*  ALT 36  ALKPHOS 52  BILITOT 2.4*  PROT 5.1*  ALBUMIN 3.0*   Recent Labs  Lab 04/12/23 0315  LIPASE 28   No results for input(s): "AMMONIA" in the last 168 hours. Coagulation Profile: No results for input(s): "INR", "PROTIME" in the last 168 hours. Cardiac Enzymes: No results for input(s): "CKTOTAL", "CKMB", "CKMBINDEX", "TROPONINI" in the last 168 hours. BNP (last 3 results) No  results for input(s): "PROBNP" in the last 8760 hours. HbA1C: No results for input(s): "HGBA1C" in the last 72 hours. CBG: No results for input(s): "GLUCAP" in the last 168 hours. Lipid Profile: No results for input(s): "CHOL", "HDL", "LDLCALC", "TRIG", "CHOLHDL", "LDLDIRECT" in the last 72 hours. Thyroid Function Tests: No results for input(s): "TSH", "T4TOTAL", "FREET4", "T3FREE", "THYROIDAB" in the last 72 hours. Anemia Panel: No results for input(s): "VITAMINB12", "FOLATE", "FERRITIN", "TIBC", "IRON", "RETICCTPCT" in the last 72 hours. Urine analysis:    Component Value Date/Time   COLORURINE AMBER (A) 04/12/2023 0309   APPEARANCEUR HAZY (A) 04/12/2023 0309   LABSPEC 1.024 04/12/2023 0309   PHURINE 5.0 04/12/2023 0309   GLUCOSEU NEGATIVE 04/12/2023 0309   HGBUR NEGATIVE 04/12/2023 0309   BILIRUBINUR NEGATIVE 04/12/2023 0309   KETONESUR NEGATIVE 04/12/2023 0309   PROTEINUR NEGATIVE 04/12/2023 0309   NITRITE NEGATIVE 04/12/2023 0309   LEUKOCYTESUR NEGATIVE 04/12/2023 0309    Radiological Exams on Admission: DG Chest Port 1 View Result Date: 04/12/2023 CLINICAL DATA:  Hypoxia. EXAM: PORTABLE CHEST 1 VIEW COMPARISON:  04/22/2020 FINDINGS: The cardio pericardial silhouette is enlarged. Vascular congestion with underlying chronic interstitial coarsening. No  focal consolidation or pulmonary edema. No pleural effusion. Bones are diffusely demineralized. Telemetry leads overlie the chest. IMPRESSION: Enlargement of the cardiopericardial silhouette with vascular congestion. Electronically Signed   By: Kennith Center M.D.   On: 04/12/2023 06:18   CT ABDOMEN PELVIS W CONTRAST Result Date: 04/12/2023 CLINICAL DATA:  89 year old female with left lower quadrant abdominal pain for 3 days. EXAM: CT ABDOMEN AND PELVIS WITH CONTRAST TECHNIQUE: Multidetector CT imaging of the abdomen and pelvis was performed using the standard protocol following bolus administration of intravenous contrast. RADIATION DOSE REDUCTION: This exam was performed according to the departmental dose-optimization program which includes automated exposure control, adjustment of the mA and/or kV according to patient size and/or use of iterative reconstruction technique. CONTRAST:  OMNIPAQUE IOHEXOL 300 MG/ML  SOLN COMPARISON:  None Available. FINDINGS: Lower chest: Moderate to large gastric hiatal hernia, mostly intrathoracic stomach is demonstrated. Superimposed mild cardiomegaly. No pericardial effusion. Tortuous descending thoracic aorta. Confluent and enhancing atelectasis in the medial basal segment of the left lower lobe. Trace superimposed left pleural effusion. Mild right lung base atelectasis. Hepatobiliary: Cholecystectomy.  Negative liver. Pancreas: Partially atrophied. Spleen: Negative. Adrenals/Urinary Tract: Normal adrenal glands. Normal kidneys. Symmetric renal enhancement and contrast excretion. Decompressed ureters. Decompressed urinary bladder. Stomach/Bowel: Diverticulosis in the distal descending colon and throughout much of the sigmoid. Roughly 8 cm segment of abnormal distal large bowel wall thickening, inflammation, indistinct appearance in the left lower quadrant (coronal image 27) with underlying diverticula. Mesenteric stranding and spiculation. The inflammation abates by the  level of the bladder dome. No extraluminal gas or fluid is identified. The upstream descending colon is nondilated. Negative transverse colon, negative right colon. Diminutive or absent appendix is not identified. Nondilated small bowel. Mild secondary inflammation of left lower abdominal small bowel loops adjacent to the abnormal colon. Largely intrathoracic stomach. Gastric antrum remains within the abdomen. Decompressed duodenum. No free air or free fluid. Vascular/Lymphatic: Extensive Aortoiliac calcified atherosclerosis. Major arterial structures in the abdomen and pelvis remain patent. Portal venous system is patent. No lymphadenopathy. Reproductive: Surgically absent uterus. But large 8.1 cm cystic mass in the central pelvis inseparable from the vaginal cough, simple fluid density and mildly thickened appearance of adjacent adnexal tissue (series 2, image 71). No inflammation associated. Other: No pelvis free fluid. Musculoskeletal: Grade 1-grade 2 spondylolisthesis in  the lower lumbar spine with advanced facet degeneration. No acute or suspicious osseous lesion identified. IMPRESSION: 1. Abnormal 8 cm segment of the distal large bowel at the junction of the descending and sigmoid colon. Favor Acute Diverticulitis/Colitis but recommend ensuring colon cancer screening is up-to-date as adenocarcinoma can sometimes have a similar appearance and presentation. No abscess or complicating features. 2. Positive also for a Large 8.1 cm cystic mass in the central pelvis, inseparable from the vaginal cuff and adnexa. Recommend follow-up pelvic ultrasound in 6-12 months. Reference: JACR 2020 Feb;17(2):248-254. 3. Large gastric hiatal hernia, mostly intrathoracic stomach. Lung base atelectasis. Trace pleural effusion. 4.  Aortic Atherosclerosis (ICD10-I70.0).  Cardiomegaly. Electronically Signed   By: Odessa Fleming M.D.   On: 04/12/2023 04:46    EKG: Independently reviewed.  Sinus rhythm, chronic  LBBB  Assessment/Plan Principal Problem:   Acute diverticulitis Active Problems:   Diverticulitis  (please populate well all problems here in Problem List. (For example, if patient is on BP meds at home and you resume or decide to hold them, it is a problem that needs to be her. Same for CAD, COPD, HLD and so on)  Acute diverticulitis -CT abdominal pelvis reviewed, no perforation or abscess formation found. -Continue Zosyn for now, likely can be transferred to p.o. antibiotics once pain is controlled and able to tolerate p.o. in next 24-48 hours. -Patient developed lethargy and breathing rate slowed down after receiving morphine.  Will try to avoid morphine.  Will use Dilaudid instead. -Patient still appeared to be dehydrated, will start her on maintenance IV fluid -Discussed with patient, patient agreed with n.p.o. for today to improve pain control. -Outpatient follow-up with GI for colonoscopy in 6 weeks to rule out malignancy  Adnexal mass -Incidental finding on today's CT abdomen pelvis -Patient made aware, outpatient follow-up with OB/GYN  HTN -Blood pressure borderline low, hold off losartan -Start as needed hydralazineer  Hyperthyroidism -Continue methimazole  History of TIA -No acute concerns -Continue Plavix and statin  DVT prophylaxis: Lovenox Code Status: Full code Family Communication: None at bedside Disposition Plan: Patient is sick with acute diverticulitis requiring IV antibiotics and n.p.o., expect more than 2 midnight hospital stay Consults called: None Admission status: Telemetry admission   Emeline General MD [facility] Pager [phone]  04/12/2023, 10:03 AM

## 2023-04-12 NOTE — Consult Note (Signed)
 Pharmacy Antibiotic Note  Haley Rollins is a 89 y.o. female admitted on 04/12/2023 for lower abdominal pain that's been going on since Thursday. They were admitted for treatment for an Intra-abdominal Infection . Pharmacy has been consulted for Zosyn dosing.  Plan: D1 antibiotics  Started Zosyn at 3.375 g Q8H Already received one dose at 0500 this AM Renal function at baseline (Scr 0.78) Continue to monitor renal function and follow cultures (if any are collected)  Height: 5\' 5"  (165.1 cm) Weight: 74.8 kg (165 lb) IBW/kg (Calculated) : 57  Temp (24hrs), Avg:98 F (36.7 C), Min:97.5 F (36.4 C), Max:98.5 F (36.9 C)  Recent Labs  Lab 04/12/23 0315  WBC 10.2  CREATININE 0.78    Estimated Creatinine Clearance: 48.2 mL/min (by C-G formula based on SCr of 0.78 mg/dL).    Allergies  Allergen Reactions   Sulfa Antibiotics Hives   Antimicrobials this admission: 2/22 Zosyn >>   Dose adjustments this admission: NA  Microbiology results: No cultures at this time  Thank you for allowing pharmacy to be a part of this patient's care.  Effie Shy, PharmD Pharmacy Resident  04/12/2023 1:18 PM

## 2023-04-13 DIAGNOSIS — K5792 Diverticulitis of intestine, part unspecified, without perforation or abscess without bleeding: Secondary | ICD-10-CM | POA: Diagnosis not present

## 2023-04-13 LAB — CBC
HCT: 30.7 % — ABNORMAL LOW (ref 36.0–46.0)
Hemoglobin: 9.8 g/dL — ABNORMAL LOW (ref 12.0–15.0)
MCH: 32.3 pg (ref 26.0–34.0)
MCHC: 31.9 g/dL (ref 30.0–36.0)
MCV: 101.3 fL — ABNORMAL HIGH (ref 80.0–100.0)
Platelets: 153 10*3/uL (ref 150–400)
RBC: 3.03 MIL/uL — ABNORMAL LOW (ref 3.87–5.11)
RDW: 14.8 % (ref 11.5–15.5)
WBC: 9.4 10*3/uL (ref 4.0–10.5)
nRBC: 0 % (ref 0.0–0.2)

## 2023-04-13 LAB — BASIC METABOLIC PANEL
Anion gap: 6 (ref 5–15)
BUN: 22 mg/dL (ref 8–23)
CO2: 22 mmol/L (ref 22–32)
Calcium: 8.2 mg/dL — ABNORMAL LOW (ref 8.9–10.3)
Chloride: 109 mmol/L (ref 98–111)
Creatinine, Ser: 0.96 mg/dL (ref 0.44–1.00)
GFR, Estimated: 57 mL/min — ABNORMAL LOW (ref >60)
Glucose, Bld: 88 mg/dL (ref 70–99)
Potassium: 4.2 mmol/L (ref 3.5–5.1)
Sodium: 137 mmol/L (ref 135–145)

## 2023-04-13 MED ORDER — FUROSEMIDE 10 MG/ML IJ SOLN
40.0000 mg | Freq: Once | INTRAMUSCULAR | Status: AC
  Administered 2023-04-13: 40 mg via INTRAVENOUS
  Filled 2023-04-13: qty 4

## 2023-04-13 MED ORDER — HYDROCODONE-ACETAMINOPHEN 5-325 MG PO TABS
1.0000 | ORAL_TABLET | Freq: Four times a day (QID) | ORAL | Status: DC | PRN
  Administered 2023-04-13 – 2023-04-14 (×2): 1 via ORAL
  Filled 2023-04-13 (×3): qty 1

## 2023-04-13 NOTE — Progress Notes (Signed)
 Weaned oxygen from 4L/[place] to 2l/[place]. Sating 95-98 % on 2L/[place] educated pt on coughing and deep breathing.

## 2023-04-13 NOTE — Progress Notes (Signed)
 PROGRESS NOTE    Haley Rollins  ZOX:[id number] DOB: 23-Apr-1931 DOA: 04/12/2023 PCP: Haley Ferrier, MD   Assessment & Plan:   Principal Problem:   Acute diverticulitis Active Problems:   Diverticulitis  Assessment and Plan: Acute diverticulitis: continue on IV zosyn. Norco, dilaudid prn for pain. Zofran prn for nausea/vomiting.Outpatient follow-up with GI for colonoscopy in 6 weeks to rule out malignancy   Adnexal mass: 8.1 cm as per CT abd/pelvis. Will need outpatient f/u w/ gyn and discussed w/ pt and pt's daughter, Haley Rollins, and they verbalized their understanding. Tumor markers CEA, CA125 & CA 19-9 ordered.   Acute hypoxic respiratory failure: found to 78% on RA when ambulating w/ PT. CXR showed vascular congestion. IV lasix x 1 ordered.    HTN: holding losartan. IV hydralazine prn    Hyperthyroidism: continue on home dose of methimazole    Hx of TIA: continue on home dose of plavix, statin  Hyperbilirubinemia: etiology unclear. Liver unremarkable on CT. Hx of cholecystectomy. Will continue to monitor   Depression: severity unknown. Continue on home dose of citalopram   Hx of CLL: not currently receiving treatment       DVT prophylaxis: lovenox  Code Status: full  Family Communication: discussed pt's care w/ pt's daughter, Haley Rollins, at bedside and answered their questions  Disposition Plan: likely d/c back home   Level of care: Med-Surg  Status is: Inpatient Remains inpatient appropriate because: severity of illness    Consultants:    Procedures:   Antimicrobials: zosyn    Subjective: Pt c/o abd pain  Objective: Vitals:   04/12/23 1709 04/12/23 2016 04/13/23 0407 04/13/23 0650  BP: (!) 126/49 (!) 120/49 (!) 128/48 (!) 131/50  Pulse: (!) 56 63 (!) 56 (!) 55  Resp: 20 20 20 20   Temp: 98.1 F (36.7 C) 98 F (36.7 C) 98.7 F (37.1 C) 98.1 F (36.7 C)  TempSrc: Oral   Oral  SpO2: 94% 93% 97% 97%  Weight:      Height:        Intake/Output  Summary (Last 24 hours) at 04/13/2023 0840 Last data filed at 04/13/2023 0300 Gross per 24 hour  Intake 1755.45 ml  Output --  Net 1755.45 ml   Filed Weights   04/12/23 0300  Weight: 74.8 kg    Examination:  General exam: Appears calm and comfortable  Respiratory system: decreased breath sounds b/l  Cardiovascular system: S1 & S2 +. No rubs, gallops or clicks.  Gastrointestinal system: Abdomen is nondistended, soft and nontender. Normal bowel sounds heard. Central nervous system: Alert and oriented. Moves all extremities  Psychiatry: Judgement and insight appears at baseline. Mood & affect appropriate.     Data Reviewed: I have personally reviewed following labs and imaging studies  CBC: Recent Labs  Lab 04/12/23 0315 04/13/23 0652  WBC 10.2 9.4  NEUTROABS 6.9  --   HGB 11.4* 9.8*  HCT 34.3* 30.7*  MCV 97.7 101.3*  PLT 177 153   Basic Metabolic Panel: Recent Labs  Lab 04/12/23 0315 04/12/23 0509 04/13/23 0652  NA 136  --  137  K 3.2*  --  4.2  CL 105  --  109  CO2 21*  --  22  GLUCOSE 115*  --  88  BUN 21  --  22  CREATININE 0.78  --  0.96  CALCIUM 8.1*  --  8.2*  MG  --  1.9  --    GFR: Estimated Creatinine Clearance: 40.2 mL/min (by C-G  formula based on SCr of 0.96 mg/dL). Liver Function Tests: Recent Labs  Lab 04/12/23 0315  AST 61*  ALT 36  ALKPHOS 52  BILITOT 2.4*  PROT 5.1*  ALBUMIN 3.0*   Recent Labs  Lab 04/12/23 0315  LIPASE 28   No results for input(s): "AMMONIA" in the last 168 hours. Coagulation Profile: No results for input(s): "INR", "PROTIME" in the last 168 hours. Cardiac Enzymes: No results for input(s): "CKTOTAL", "CKMB", "CKMBINDEX", "TROPONINI" in the last 168 hours. BNP (last 3 results) No results for input(s): "PROBNP" in the last 8760 hours. HbA1C: No results for input(s): "HGBA1C" in the last 72 hours. CBG: No results for input(s): "GLUCAP" in the last 168 hours. Lipid Profile: No results for input(s): "CHOL",  "HDL", "LDLCALC", "TRIG", "CHOLHDL", "LDLDIRECT" in the last 72 hours. Thyroid Function Tests: No results for input(s): "TSH", "T4TOTAL", "FREET4", "T3FREE", "THYROIDAB" in the last 72 hours. Anemia Panel: No results for input(s): "VITAMINB12", "FOLATE", "FERRITIN", "TIBC", "IRON", "RETICCTPCT" in the last 72 hours. Sepsis Labs: No results for input(s): "PROCALCITON", "LATICACIDVEN" in the last 168 hours.  No results found for this or any previous visit (from the past 240 hours).       Radiology Studies: DG Chest Port 1 View Result Date: 04/12/2023 CLINICAL DATA:  Hypoxia. EXAM: PORTABLE CHEST 1 VIEW COMPARISON:  04/22/2020 FINDINGS: The cardio pericardial silhouette is enlarged. Vascular congestion with underlying chronic interstitial coarsening. No focal consolidation or pulmonary edema. No pleural effusion. Bones are diffusely demineralized. Telemetry leads overlie the chest. IMPRESSION: Enlargement of the cardiopericardial silhouette with vascular congestion. Electronically Signed   By: Kennith Center M.D.   On: 04/12/2023 06:18   CT ABDOMEN PELVIS W CONTRAST Result Date: 04/12/2023 CLINICAL DATA:  89 year old female with left lower quadrant abdominal pain for 3 days. EXAM: CT ABDOMEN AND PELVIS WITH CONTRAST TECHNIQUE: Multidetector CT imaging of the abdomen and pelvis was performed using the standard protocol following bolus administration of intravenous contrast. RADIATION DOSE REDUCTION: This exam was performed according to the departmental dose-optimization program which includes automated exposure control, adjustment of the mA and/or kV according to patient size and/or use of iterative reconstruction technique. CONTRAST:  OMNIPAQUE IOHEXOL 300 MG/ML  SOLN COMPARISON:  None Available. FINDINGS: Lower chest: Moderate to large gastric hiatal hernia, mostly intrathoracic stomach is demonstrated. Superimposed mild cardiomegaly. No pericardial effusion. Tortuous descending thoracic aorta.  Confluent and enhancing atelectasis in the medial basal segment of the left lower lobe. Trace superimposed left pleural effusion. Mild right lung base atelectasis. Hepatobiliary: Cholecystectomy.  Negative liver. Pancreas: Partially atrophied. Spleen: Negative. Adrenals/Urinary Tract: Normal adrenal glands. Normal kidneys. Symmetric renal enhancement and contrast excretion. Decompressed ureters. Decompressed urinary bladder. Stomach/Bowel: Diverticulosis in the distal descending colon and throughout much of the sigmoid. Roughly 8 cm segment of abnormal distal large bowel wall thickening, inflammation, indistinct appearance in the left lower quadrant (coronal image 27) with underlying diverticula. Mesenteric stranding and spiculation. The inflammation abates by the level of the bladder dome. No extraluminal gas or fluid is identified. The upstream descending colon is nondilated. Negative transverse colon, negative right colon. Diminutive or absent appendix is not identified. Nondilated small bowel. Mild secondary inflammation of left lower abdominal small bowel loops adjacent to the abnormal colon. Largely intrathoracic stomach. Gastric antrum remains within the abdomen. Decompressed duodenum. No free air or free fluid. Vascular/Lymphatic: Extensive Aortoiliac calcified atherosclerosis. Major arterial structures in the abdomen and pelvis remain patent. Portal venous system is patent. No lymphadenopathy. Reproductive: Surgically absent uterus. But  large 8.1 cm cystic mass in the central pelvis inseparable from the vaginal cough, simple fluid density and mildly thickened appearance of adjacent adnexal tissue (series 2, image 71). No inflammation associated. Other: No pelvis free fluid. Musculoskeletal: Grade 1-grade 2 spondylolisthesis in the lower lumbar spine with advanced facet degeneration. No acute or suspicious osseous lesion identified. IMPRESSION: 1. Abnormal 8 cm segment of the distal large bowel at the  junction of the descending and sigmoid colon. Favor Acute Diverticulitis/Colitis but recommend ensuring colon cancer screening is up-to-date as adenocarcinoma can sometimes have a similar appearance and presentation. No abscess or complicating features. 2. Positive also for a Large 8.1 cm cystic mass in the central pelvis, inseparable from the vaginal cuff and adnexa. Recommend follow-up pelvic ultrasound in 6-12 months. Reference: JACR 2020 Feb;17(2):248-254. 3. Large gastric hiatal hernia, mostly intrathoracic stomach. Lung base atelectasis. Trace pleural effusion. 4.  Aortic Atherosclerosis (ICD10-I70.0).  Cardiomegaly. Electronically Signed   By: Odessa Fleming M.D.   On: 04/12/2023 04:46        Scheduled Meds:  acidophilus  1 capsule Oral Daily   atorvastatin  40 mg Oral q1800   citalopram  20 mg Oral Daily   clopidogrel  75 mg Oral Daily   enoxaparin (LOVENOX) injection  40 mg Subcutaneous Q24H   methimazole  2.5 mg Oral Daily   Continuous Infusions:  sodium chloride 100 mL/hr at 04/13/23 0039   piperacillin-tazobactam (ZOSYN)  IV 3.375 g (04/13/23 0503)     LOS: 1 day       Charise Killian, MD [facility] Pager [phone]  If 7PM-7AM, please contact night-coverage [email or web address] 04/13/2023, 8:40 AM

## 2023-04-13 NOTE — TOC Initial Note (Signed)
 Transition of Care [facility]) - Initial/Assessment Note    Patient Details  Name: Haley Rollins MRN: [id number] Date of Birth: 03/03/1931  Transition of Care [facility]) CM/SW Contact:    Liliana Cline, LCSW Phone Number: 04/13/2023, 4:53 PM  Clinical Narrative:                 Per chart review, patient with forgetfulness. CSW called daughter Sedalia Muta to discuss therapy recs for home health. Per Diane, patient lives alone. She has an aide Tuesday and Thursdays, and can add Wednesday if needed. PCP is Dr. Graciela Husbands. Pharmacy is [facility]. Patient has a walker, rollator, and wheelchair at home. Patient's aide provides transportation to appointments.  Diane states she is agreeable to home health - provided Medicare Care Compare list of agencies for her to review with her sisters. She will follow up with weekday [facility] on agency preference.  Expected Discharge Plan: Home w Home Health Services Barriers to Discharge: Continued Medical Work up   Patient Goals and CMS Choice Patient states their goals for this hospitalization and ongoing recovery are:: home with home health CMS Medicare.gov Compare Post Acute Care list provided to:: Patient Represenative (must comment) Choice offered to / list presented to : Adult Children      Expected Discharge Plan and Services       Living arrangements for the past 2 months: Single Family Home                                      Prior Living Arrangements/Services Living arrangements for the past 2 months: Single Family Home Lives with:: Self Patient language and need for interpreter reviewed:: Yes Do you feel safe going back to the place where you live?: Yes      Need for Family Participation in Patient Care: Yes (Comment) Care giver support system in place?: Yes (comment) Current home services: DME, Homehealth aide Criminal Activity/Legal Involvement Pertinent to Current Situation/Hospitalization: No - Comment as needed  Activities of  Daily Living   ADL Screening (condition at time of admission) Independently performs ADLs?: Yes (appropriate for developmental age) Is the patient deaf or have difficulty hearing?: No Does the patient have difficulty seeing, even when wearing glasses/contacts?: No Does the patient have difficulty concentrating, remembering, or making decisions?: No  Permission Sought/Granted Permission sought to share information with : Facility Industrial/product designer granted to share information with : Yes, Designer, fashion/clothing (by daughter Diane)              Emotional Assessment       Orientation: : Fluctuating Orientation (Suspected and/or reported Sundowners) Alcohol / Substance Use: Not Applicable Psych Involvement: No (comment)  Admission diagnosis:  Hypokalemia [E87.6] Diverticulitis [K57.92] Pelvic mass [R19.00] Hypoxia [R09.02] Lower abdominal pain [R10.30] Acute diverticulitis [K57.92] Patient Active Problem List   Diagnosis Date Noted   Acute diverticulitis 04/12/2023   Diverticulitis 04/12/2023   CLL (chronic lymphocytic leukemia) (HCC) 03/02/2020   Postural dizziness with presyncope 04/09/2019   Sinus bradycardia 04/09/2019   History of TIA (transient ischemic attack) 04/09/2019   Essential hypertension 04/09/2019   Hyperthyroidism 04/09/2019   Symptomatic bradycardia 04/09/2019   AKI (acute kidney injury) (HCC) 04/09/2019   TIA (transient ischemic attack) 01/24/2015   PCP:  Lynnea Ferrier, MD Pharmacy:   [facility] - [place], [place] - [facility] Portal to [facility]  [address] [place] [address] Phone: [phone] Fax: [phone]  [facility] - [place], [place] - [place] [address] [place] [address] Phone: [phone] Fax: [phone]  [facility] - [facility] [address] [place] [address] Phone: [phone] Fax:  [phone]     Social Drivers of Health (SDOH) Social History: SDOH Screenings   Food Insecurity: No Food Insecurity (04/12/2023)  Housing: Low Risk  (04/12/2023)  Transportation Needs: No Transportation Needs (04/12/2023)  Utilities: Not At Risk (04/12/2023)  Financial Resource Strain: Low Risk  (10/08/2022)   Received from [facility]  Social Connections: Moderately Integrated (04/12/2023)  Tobacco Use: Medium Risk (04/12/2023)   SDOH Interventions:     Readmission Risk Interventions    04/13/2023    4:53 PM  Readmission Risk Prevention Plan  Transportation Screening Complete  PCP or Specialist Appt within 5-7 Days Complete  Home Care Screening Complete  Medication Review (RN CM) Complete

## 2023-04-13 NOTE — Evaluation (Signed)
 Physical Therapy Evaluation Patient Details Name: Haley Rollins MRN: [id number] DOB: February 23, 1931 Today's Date: 04/13/2023  History of Present Illness  Pt is an 89 y.o. female with medical history significant of HTN, hypothyroidism, TIA on Plavix, CLL, presented with worsening of new onset abdominal pain.  MD assessment includes acute diverticulitis, hyperthyroidism, and adnexal mass.   Clinical Impression  Pt was pleasant and motivated to participate during the session and put forth good effort throughout. Pt required extra time and effort along with use of the bed rail with bed mobility tasks and min A to come to standing from the EOB at lowest position.  Upon standing pt was steady with no overt LOB standing statically as well as during ambulation.  Pt required min verbal cues for general sequencing with the RW but presented with no overt LOB during the session.  Of note, pt's SpO2 after amb on room air was found to be 78% once back in sitting, recovered quickly back to mid 90s once placed on 2L in room, left on 2L with nursing notified.  PT recommendation of 24/7 supervision at least initially until cleared by HHPT discussed with pt's daughter, pt's daughter in agreement. Pt will benefit from continued PT services upon discharge to safely address deficits listed in patient problem list for decreased caregiver assistance and eventual return to PLOF.               If plan is discharge home, recommend the following: A little help with walking and/or transfers;A little help with bathing/dressing/bathroom;Assistance with cooking/housework;Assist for transportation;Help with stairs or ramp for entrance   Can travel by private vehicle        Equipment Recommendations None recommended by PT  Recommendations for Other Services       Functional Status Assessment Patient has had a recent decline in their functional status and demonstrates the ability to make significant improvements in function in  a reasonable and predictable amount of time.     Precautions / Restrictions Precautions Precautions: Fall Restrictions Weight Bearing Restrictions Per Provider Order: No Other Position/Activity Restrictions: Watch SpO2      Mobility  Bed Mobility Overal bed mobility: Modified Independent             General bed mobility comments: Extra time, effort, and use of bed rail    Transfers Overall transfer level: Needs assistance Equipment used: Rolling walker (2 wheels) Transfers: Sit to/from Stand Sit to Stand: Min assist           General transfer comment: Min A to come to full upright standing with bed in low position    Ambulation/Gait Ambulation/Gait assistance: Supervision Gait Distance (Feet): 200 Feet Assistive device: Rolling walker (2 wheels) Gait Pattern/deviations: Step-through pattern, Decreased step length - right, Decreased step length - left Gait velocity: decreased     General Gait Details: Mildly reduced cadence with min verbal cues for amb closer to the RW with upright posture but steady with no overt LOB  Stairs            Wheelchair Mobility     Tilt Bed    Modified Rankin (Stroke Patients Only)       Balance Overall balance assessment: Needs assistance   Sitting balance-Leahy Scale: Normal     Standing balance support: Bilateral upper extremity supported, During functional activity Standing balance-Leahy Scale: Good  Pertinent Vitals/Pain Pain Assessment Pain Assessment: No/denies pain (General soreness from being in the bed with history of recent abdominal pain but not mentioned during evaluation)    Home Living Family/patient expects to be discharged to:: Private residence Living Arrangements: Alone Available Help at Discharge: Family;Personal care attendant;Available PRN/intermittently Type of Home: House Home Access: Stairs to enter Entrance Stairs-Rails: Left Entrance  Stairs-Number of Steps: 4   Home Layout: One level Home Equipment: Agricultural consultant (2 wheels);Rollator (4 wheels);Wheelchair - manual;Shower seat Additional Comments: Patient has a PCA 2x/wk from 9:30-1:30 who primarily assists with chores and errands    Prior Function Prior Level of Function : Independent/Modified Independent             Mobility Comments: Ind amb without AD community distances, no fall history ADLs Comments: Ind with ADLs     Extremity/Trunk Assessment   Upper Extremity Assessment Upper Extremity Assessment: Generalized weakness    Lower Extremity Assessment Lower Extremity Assessment: Generalized weakness       Communication   Communication Factors Affecting Communication: Hearing impaired    Cognition Arousal: Alert Behavior During Therapy: WFL for tasks assessed/performed   PT - Cognitive impairments: No apparent impairments                         Following commands: Intact       Cueing Cueing Techniques: Verbal cues, Visual cues     General Comments      Exercises     Assessment/Plan    PT Assessment Patient needs continued PT services  PT Problem List Decreased strength;Decreased activity tolerance;Decreased balance;Decreased mobility;Decreased knowledge of use of DME       PT Treatment Interventions DME instruction;Gait training;Patient/family education;Stair training;Functional mobility training;Therapeutic activities;Therapeutic exercise;Balance training    PT Goals (Current goals can be found in the Care Plan section)  Acute Rehab PT Goals Patient Stated Goal: To get stronger PT Goal Formulation: With patient/family Time For Goal Achievement: 04/26/23 Potential to Achieve Goals: Good    Frequency Min 1X/week     Co-evaluation               AM-PAC PT "6 Clicks" Mobility  Outcome Measure Help needed turning from your back to your side while in a flat bed without using bedrails?: A Little Help needed  moving from lying on your back to sitting on the side of a flat bed without using bedrails?: A Little Help needed moving to and from a bed to a chair (including a wheelchair)?: A Little Help needed standing up from a chair using your arms (e.g., wheelchair or bedside chair)?: A Little Help needed to walk in hospital room?: A Little Help needed climbing 3-5 steps with a railing? : A Little 6 Click Score: 18    End of Session Equipment Utilized During Treatment: Gait belt Activity Tolerance: Patient tolerated treatment well Patient left: in chair;with call bell/phone within reach;with chair alarm set;with family/visitor present Nurse Communication: Mobility status;Other (comment) (SpO2 after amb on room air to 78%, recovered quickly back to mid 90s once on 2L in room, left on 2L) PT Visit Diagnosis: Difficulty in walking, not elsewhere classified (R26.2);Muscle weakness (generalized) (M62.81)    Time: 4401-0272 PT Time Calculation (min) (ACUTE ONLY): 37 min   Charges:   PT Evaluation $PT Eval Moderate Complexity: 1 Mod PT Treatments $Gait Training: 8-22 mins PT General Charges $$ ACUTE PT VISIT: 1 Visit       D. Scott Aydenn Gervin  PT, DPT 04/13/23, 3:07 PM

## 2023-04-13 NOTE — Plan of Care (Signed)

## 2023-04-14 DIAGNOSIS — K5792 Diverticulitis of intestine, part unspecified, without perforation or abscess without bleeding: Secondary | ICD-10-CM | POA: Diagnosis not present

## 2023-04-14 LAB — CBC
HCT: 30.7 % — ABNORMAL LOW (ref 36.0–46.0)
Hemoglobin: 10.2 g/dL — ABNORMAL LOW (ref 12.0–15.0)
MCH: 32.4 pg (ref 26.0–34.0)
MCHC: 33.2 g/dL (ref 30.0–36.0)
MCV: 97.5 fL (ref 80.0–100.0)
Platelets: 159 10*3/uL (ref 150–400)
RBC: 3.15 MIL/uL — ABNORMAL LOW (ref 3.87–5.11)
RDW: 14.4 % (ref 11.5–15.5)
WBC: 7.6 10*3/uL (ref 4.0–10.5)
nRBC: 0 % (ref 0.0–0.2)

## 2023-04-14 LAB — COMPREHENSIVE METABOLIC PANEL
ALT: 94 U/L — ABNORMAL HIGH (ref 0–44)
AST: 103 U/L — ABNORMAL HIGH (ref 15–41)
Albumin: 2.7 g/dL — ABNORMAL LOW (ref 3.5–5.0)
Alkaline Phosphatase: 117 U/L (ref 38–126)
Anion gap: 8 (ref 5–15)
BUN: 21 mg/dL (ref 8–23)
CO2: 24 mmol/L (ref 22–32)
Calcium: 8.3 mg/dL — ABNORMAL LOW (ref 8.9–10.3)
Chloride: 105 mmol/L (ref 98–111)
Creatinine, Ser: 0.84 mg/dL (ref 0.44–1.00)
GFR, Estimated: >60 mL/min (ref >60)
Glucose, Bld: 112 mg/dL — ABNORMAL HIGH (ref 70–99)
Potassium: 3.5 mmol/L (ref 3.5–5.1)
Sodium: 137 mmol/L (ref 135–145)
Total Bilirubin: 1.4 mg/dL — ABNORMAL HIGH (ref 0.0–1.2)
Total Protein: 5.2 g/dL — ABNORMAL LOW (ref 6.5–8.1)

## 2023-04-14 MED ORDER — FUROSEMIDE 10 MG/ML IJ SOLN
40.0000 mg | Freq: Once | INTRAMUSCULAR | Status: AC
  Administered 2023-04-14: 40 mg via INTRAVENOUS
  Filled 2023-04-14: qty 4

## 2023-04-14 MED ORDER — LOSARTAN POTASSIUM 25 MG PO TABS
25.0000 mg | ORAL_TABLET | Freq: Every day | ORAL | Status: DC
  Administered 2023-04-14 – 2023-04-15 (×2): 25 mg via ORAL
  Filled 2023-04-14 (×2): qty 1

## 2023-04-14 NOTE — Progress Notes (Signed)
 SATURATION QUALIFICATIONS:   Patient Saturations on Room Air at Rest = 90%  Patient Saturations on Room Air while Ambulating = 86%  Patient Saturations on 2 Liters of oxygen while Ambulating = 95%  Please briefly explain why patient needs home oxygen: Desaturation on room air

## 2023-04-14 NOTE — Progress Notes (Signed)
   04/14/23 1500  Spiritual Encounters  Type of Visit Initial  Care provided to: Pt and family  Referral source Code page  Reason for visit Code (Rapid Response)  OnCall Visit No   Chaplain spiritual support services remain available as the need arises.

## 2023-04-14 NOTE — Progress Notes (Signed)
 Physical Therapy Treatment Patient Details Name: Haley Rollins MRN: [id number] DOB: 07-Nov-1931 Today's Date: 04/14/2023   History of Present Illness Pt is an 89 y.o. female with medical history significant of HTN, hypothyroidism, TIA on Plavix, CLL, presented with worsening of new onset abdominal pain.  MD assessment includes acute diverticulitis, hyperthyroidism, and adnexal mass.    PT Comments  Patient received supine in bed, agreeable to PT tx session this AM. Patient able to complete bed mobility MOD I, increased time. Patient report mild lightheadedness, BP stable: 142/52. Patient on 2L via [place] at start of session with Sp02: 96%. Removed and completed mobility on RA, with close monitoring. Patient able to complete STS with CGA, and ambulate with supervision to/from bathroom with RW. Patient did require Min A from low toilet seat height, Mod I with toilet hygiene. With mobility, Pt's sp02 dropped to 89% on RA, but improved to 94-95% with pursed lip breathing and seated rest break. RN notified. Patient left in recliner with all needs in reach. Discharge recommendation remains appropriate at this time. Will continue to benefit from skilled acute PT services, will continue to follow acutely.    If plan is discharge home, recommend the following: A little help with walking and/or transfers;A little help with bathing/dressing/bathroom;Assistance with cooking/housework;Assist for transportation;Help with stairs or ramp for entrance   Can travel by private vehicle        Equipment Recommendations  None recommended by PT    Recommendations for Other Services       Precautions / Restrictions Precautions Precautions: Fall Restrictions Weight Bearing Restrictions Per Provider Order: No     Mobility  Bed Mobility Overal bed mobility: Modified Independent             General bed mobility comments: patient require increased time, effort but able to complete without physical assist. mild  lighteadedness seated EOB but improved, BP stable: 142/52, Sp02 97% on 2L via [place]    Transfers Overall transfer level: Needs assistance Equipment used: Rolling walker (2 wheels) Transfers: Sit to/from Stand Sit to Stand: Contact guard assist, Min assist           General transfer comment: Patient able to complete STS from bed height with CGA and use of RW; from low toilet height Min A required, cues for hand placement to promote IND. Completed on RA with Sp02 92-93%.    Ambulation/Gait Ambulation/Gait assistance: Supervision Gait Distance (Feet): 50 Feet Assistive device: Rolling walker (2 wheels) Gait Pattern/deviations: Step-through pattern, Decreased step length - right, Decreased step length - left Gait velocity: Decreased     General Gait Details: with RW x 50 ft, limited distance due to fatigue. Completed on RA, with Sp02 monitored closely. Desat to 89% on RA, but improved to 94-95% with Pursed Lip Breathing and Seated Rest Break.   Stairs             Wheelchair Mobility     Tilt Bed    Modified Rankin (Stroke Patients Only)       Balance Overall balance assessment: Needs assistance   Sitting balance-Leahy Scale: Normal     Standing balance support: Bilateral upper extremity supported, During functional activity Standing balance-Leahy Scale: Good Standing balance comment: use of RW for support                            Communication Communication Communication: No apparent difficulties  Cognition Arousal: Alert Behavior During Therapy: [facility] for  tasks assessed/performed   PT - Cognitive impairments: No apparent impairments                         Following commands: Intact      Cueing    Exercises Other Exercises Other Exercises: able to complete peri care and donn/doff clothing Mod I, increased time.    General Comments        Pertinent Vitals/Pain Pain Assessment Pain Assessment: No/denies pain    Home Living                           Prior Function            PT Goals (current goals can now be found in the care plan section) Acute Rehab PT Goals Patient Stated Goal: To get stronger PT Goal Formulation: With patient/family Time For Goal Achievement: 04/26/23 Potential to Achieve Goals: Good Progress towards PT goals: Progressing toward goals    Frequency    Min 1X/week      PT Plan      Co-evaluation              AM-PAC PT "6 Clicks" Mobility   Outcome Measure  Help needed turning from your back to your side while in a flat bed without using bedrails?: A Little Help needed moving from lying on your back to sitting on the side of a flat bed without using bedrails?: A Little Help needed moving to and from a bed to a chair (including a wheelchair)?: A Little Help needed standing up from a chair using your arms (e.g., wheelchair or bedside chair)?: A Little Help needed to walk in hospital room?: A Little Help needed climbing 3-5 steps with a railing? : A Little 6 Click Score: 18    End of Session Equipment Utilized During Treatment: Gait belt Activity Tolerance: Patient tolerated treatment well Patient left: in chair;with call bell/phone within reach;with chair alarm set;with family/visitor present Nurse Communication: Mobility status;Other (comment) (Sp02 with mobility) PT Visit Diagnosis: Difficulty in walking, not elsewhere classified (R26.2);Muscle weakness (generalized) (M62.81)     Time: 1610-9604 PT Time Calculation (min) (ACUTE ONLY): 24 min  Charges:    $Therapeutic Activity: 8-22 mins PT General Charges $$ ACUTE PT VISIT: 1 Visit                     Creed Copper Fairly, PT, DPT 04/14/23 10:42 AM

## 2023-04-14 NOTE — Plan of Care (Signed)

## 2023-04-14 NOTE — Progress Notes (Signed)
 PROGRESS NOTE    Haley Rollins  ZOX:[id number] DOB: 1931/11/04 DOA: 04/12/2023 PCP: Lynnea Ferrier, MD   Assessment & Plan:   Principal Problem:   Acute diverticulitis Active Problems:   Diverticulitis  Assessment and Plan: Acute diverticulitis: continue on IV zosyn. C/o abd pain today.  Norco, dilaudid prn for pain. Zofran prn for nausea/vomiting. Outpatient follow-up with GI for colonoscopy in 6 weeks to rule out malignancy   Adnexal mass: 8.1 cm as per CT abd/pelvis. Will need outpatient f/u w/ PCP and/or gyn. Discussed w/ pt and pt's daughter, [name], and they verbalized their understanding. Tumor markers CEA, CA125 & CA 19-9 are pending   Acute hypoxic respiratory failure: found to 78% on RA when ambulating w/ PT. CXR showed vascular congestion. IV lasix x 1 ordered. Continue on supplemental oxygen and wean as tolerated.   HTN: restart home dose of losartan.    Hyperthyroidism: continue on home dose of methimazole     Hx of TIA: continue on home dose of plavix. Holding statin   Hyperbilirubinemia: etiology unclear. Liver unremarkable on CT. Hx of cholecystectomy. Trending down today. Will continue to monitor   Transaminitis: trending up today. Etiology unclear, possibly secondary to IV zosyn use. Liver unremarkable on CT. Holding statin. Will continue to monitor   Depression: severity unknown. Continue on home dose of citalopram   Hx of CLL: not currently receiving treatment       DVT prophylaxis: lovenox  Code Status: full  Family Communication: discussed pt's care w/ pt's daughter, [name], at bedside and answered their questions  Disposition Plan: likely d/c back home   Level of care: Med-Surg  Status is: Inpatient Remains inpatient appropriate because: severity of illness    Consultants:    Procedures:   Antimicrobials: zosyn    Subjective: Pt c/o abd pain still.   Objective: Vitals:   04/13/23 1541 04/13/23 2052 04/14/23 0419 04/14/23 0839   BP: (!) 146/53 (!) 123/58 (!) 144/60 (!) 153/58  Pulse: 60 (!) 58 62 (!) 56  Resp: 18 20 (!) 22 (!) 22  Temp: 98.4 F (36.9 C) 99 F (37.2 C) 99.1 F (37.3 C) 98.8 F (37.1 C)  TempSrc: Oral Oral Oral Oral  SpO2: 96% 93% 93% 94%  Weight:      Height:        Intake/Output Summary (Last 24 hours) at 04/14/2023 0849 Last data filed at 04/13/2023 1549 Gross per 24 hour  Intake 120 ml  Output --  Net 120 ml   Filed Weights   04/12/23 0300  Weight: 74.8 kg    Examination:  General exam: appears comfortable   Respiratory system: diminished breath sounds b/l  Cardiovascular system: S1/S2+. No rubs or clicks  Gastrointestinal system: abd is soft, voluntary guarding, tenderness to palpation, ND & hypoactive bowel sounds Central nervous system: alert & oriented. Moves all extremities Psychiatry: Judgement and insight appears at baseline. Flat mood and affect     Data Reviewed: I have personally reviewed following labs and imaging studies  CBC: Recent Labs  Lab 04/12/23 0315 04/13/23 0652 04/14/23 0501  WBC 10.2 9.4 7.6  NEUTROABS 6.9  --   --   HGB 11.4* 9.8* 10.2*  HCT 34.3* 30.7* 30.7*  MCV 97.7 101.3* 97.5  PLT 177 153 159   Basic Metabolic Panel: Recent Labs  Lab 04/12/23 0315 04/12/23 0509 04/13/23 0652 04/14/23 0501  NA 136  --  137 137  K 3.2*  --  4.2 3.5  CL  105  --  109 105  CO2 21*  --  22 24  GLUCOSE 115*  --  88 112*  BUN 21  --  22 21  CREATININE 0.78  --  0.96 0.84  CALCIUM 8.1*  --  8.2* 8.3*  MG  --  1.9  --   --    GFR: Estimated Creatinine Clearance: 45.9 mL/min (by C-G formula based on SCr of 0.84 mg/dL). Liver Function Tests: Recent Labs  Lab 04/12/23 0315 04/14/23 0501  AST 61* 103*  ALT 36 94*  ALKPHOS 52 117  BILITOT 2.4* 1.4*  PROT 5.1* 5.2*  ALBUMIN 3.0* 2.7*   Recent Labs  Lab 04/12/23 0315  LIPASE 28   No results for input(s): "AMMONIA" in the last 168 hours. Coagulation Profile: No results for input(s): "INR",  "PROTIME" in the last 168 hours. Cardiac Enzymes: No results for input(s): "CKTOTAL", "CKMB", "CKMBINDEX", "TROPONINI" in the last 168 hours. BNP (last 3 results) No results for input(s): "PROBNP" in the last 8760 hours. HbA1C: No results for input(s): "HGBA1C" in the last 72 hours. CBG: No results for input(s): "GLUCAP" in the last 168 hours. Lipid Profile: No results for input(s): "CHOL", "HDL", "LDLCALC", "TRIG", "CHOLHDL", "LDLDIRECT" in the last 72 hours. Thyroid Function Tests: No results for input(s): "TSH", "T4TOTAL", "FREET4", "T3FREE", "THYROIDAB" in the last 72 hours. Anemia Panel: No results for input(s): "VITAMINB12", "FOLATE", "FERRITIN", "TIBC", "IRON", "RETICCTPCT" in the last 72 hours. Sepsis Labs: No results for input(s): "PROCALCITON", "LATICACIDVEN" in the last 168 hours.  No results found for this or any previous visit (from the past 240 hours).       Radiology Studies: No results found.       Scheduled Meds:  acidophilus  1 capsule Oral Daily   citalopram  20 mg Oral Daily   clopidogrel  75 mg Oral Daily   enoxaparin (LOVENOX) injection  40 mg Subcutaneous Q24H   methimazole  2.5 mg Oral Daily   Continuous Infusions:  piperacillin-tazobactam (ZOSYN)  IV 3.375 g (04/14/23 0538)     LOS: 2 days       Charise Killian, MD [facility] Pager [phone]  If 7PM-7AM, please contact night-coverage [email or web address] 04/14/2023, 8:49 AM

## 2023-04-15 DIAGNOSIS — K5792 Diverticulitis of intestine, part unspecified, without perforation or abscess without bleeding: Secondary | ICD-10-CM | POA: Diagnosis not present

## 2023-04-15 LAB — COMPREHENSIVE METABOLIC PANEL
ALT: 71 U/L — ABNORMAL HIGH (ref 0–44)
AST: 65 U/L — ABNORMAL HIGH (ref 15–41)
Albumin: 2.7 g/dL — ABNORMAL LOW (ref 3.5–5.0)
Alkaline Phosphatase: 117 U/L (ref 38–126)
Anion gap: 7 (ref 5–15)
BUN: 14 mg/dL (ref 8–23)
CO2: 29 mmol/L (ref 22–32)
Calcium: 8.4 mg/dL — ABNORMAL LOW (ref 8.9–10.3)
Chloride: 103 mmol/L (ref 98–111)
Creatinine, Ser: 0.79 mg/dL (ref 0.44–1.00)
GFR, Estimated: >60 mL/min (ref >60)
Glucose, Bld: 93 mg/dL (ref 70–99)
Potassium: 3.2 mmol/L — ABNORMAL LOW (ref 3.5–5.1)
Sodium: 139 mmol/L (ref 135–145)
Total Bilirubin: 1.2 mg/dL (ref 0.0–1.2)
Total Protein: 5.5 g/dL — ABNORMAL LOW (ref 6.5–8.1)

## 2023-04-15 LAB — CBC
HCT: 32.2 % — ABNORMAL LOW (ref 36.0–46.0)
Hemoglobin: 10.8 g/dL — ABNORMAL LOW (ref 12.0–15.0)
MCH: 32 pg (ref 26.0–34.0)
MCHC: 33.5 g/dL (ref 30.0–36.0)
MCV: 95.3 fL (ref 80.0–100.0)
Platelets: 200 10*3/uL (ref 150–400)
RBC: 3.38 MIL/uL — ABNORMAL LOW (ref 3.87–5.11)
RDW: 14.1 % (ref 11.5–15.5)
WBC: 7.3 10*3/uL (ref 4.0–10.5)
nRBC: 0 % (ref 0.0–0.2)

## 2023-04-15 LAB — CA 125: Cancer Antigen (CA) 125: 14.3 U/mL (ref 0.0–38.1)

## 2023-04-15 LAB — CEA: CEA: 4.5 ng/mL (ref 0.0–4.7)

## 2023-04-15 MED ORDER — HYDROCODONE-ACETAMINOPHEN 5-325 MG PO TABS: 1.0000 | ORAL_TABLET | Freq: Four times a day (QID) | ORAL | 0 refills | Status: AC | PRN

## 2023-04-15 MED ORDER — CIPROFLOXACIN HCL 500 MG PO TABS: 500.0000 mg | ORAL_TABLET | Freq: Two times a day (BID) | ORAL | 0 refills | Status: AC

## 2023-04-15 MED ORDER — METRONIDAZOLE 500 MG PO TABS: 500.0000 mg | ORAL_TABLET | Freq: Three times a day (TID) | ORAL | 0 refills | Status: AC

## 2023-04-15 MED ORDER — POTASSIUM CHLORIDE CRYS ER 20 MEQ PO TBCR: 40.0000 meq | EXTENDED_RELEASE_TABLET | Freq: Once | ORAL | Status: DC

## 2023-04-15 NOTE — Care Management Important Message (Signed)
 Important Message  Patient Details  Name: Haley Rollins MRN: [id number] Date of Birth: 07-18-1931   Important Message Given:  Yes - Medicare IM     Cristela Blue, CMA 04/15/2023, 9:50 AM

## 2023-04-15 NOTE — Discharge Summary (Signed)
 Physician Discharge Summary  Haley Rollins ZOX:[id number] DOB: October 25, 1931 DOA: 04/12/2023  PCP: Lynnea Ferrier, MD  Admit date: 04/12/2023 Discharge date: 04/15/2023  Admitted From: home  Disposition:  home   Recommendations for Outpatient Follow-up:  Follow up with PCP in 1 week Need to CMP to get LFTs checked for transaminitis F/u on abnormal CT findings as stated below F/u on tumor markers ordered inpatient (CA125, CEA, CA19-9) Repeat oxygen desaturation test outpatient to determine if pt still needs oxygen   Home Health: yes Equipment/Devices: 3L [place]  Discharge Condition: stable  CODE STATUS: full  Diet recommendation: Regular   Brief/Interim Summary: HPI was taken from Dr. Chipper Herb: Haley Rollins is a 89 y.o. female with medical history significant of HTN, hypothyroidism, TIA on Plavix, CLL, presented with worsening of new onset abdominal pain.   Symptoms started 2 days ago, patient became constipated and started to have severe left-sided abdominal pain, cramping-like intermittent associated feeling of bowel movement but has been remained constipated for the last 2 days.  Has had subjective fever but no chills.  She denied any such pain before.  She also felt nauseous but no vomiting.  Denied any urinary problems no chest pain or shortness of breath.   In the ED, patient was found afebrile, nontachycardic blood pressure 120/80 O2 saturation 99% on room air.  Blood work showed WBC 10.2, creatinine 0.7 BUN 21, K3.2, CT abdomen pelvis showed acute diverticulitis in the junction of descending and sigmoid colon about 8 cm segment, incidental finding of large 8.1 cm cystic mass in the central pelvis inseparable from vaginal cuff and adnexa.   Patient was given Zosyn and IV morphine.  Briefly patient became lethargic and hypoxic, but resolved soon after.  Discharge Diagnoses:  Principal Problem:   Acute diverticulitis Active Problems:   Diverticulitis  Acute diverticulitis:  continue on IV zosyn while inpatient d/c home on po cipro, metronidazole at d/c to complete the course. Norco, dilaudid prn for pain. Zofran prn for nausea/vomiting. Outpatient follow-up with GI for colonoscopy in 6 weeks to rule out malignancy   Adnexal mass: 8.1 cm as per CT abd/pelvis. Will need outpatient f/u w/ PCP and/or gyn. Discussed w/ pt and pt's daughter, Haley Rollins, and they verbalized their understanding. Tumor markers CEA, CA125 & CA 19-9 are pending   Acute hypoxic respiratory failure: found to 78% on RA when ambulating w/ PT. CXR showed vascular congestion. S/p IV laisx x2. Continue on supplemental oxygen and wean as tolerated.   HTN: restart home dose of losartan.    Hyperthyroidism: continue on home dose of methimazole     Hx of TIA: continue on home dose of plavix. Holding statin   Hyperbilirubinemia: etiology unclear. Liver unremarkable on CT. Hx of cholecystectomy. Back WNL today. Resolved   Transaminitis: still elevated but trending down. Etiology unclear, possibly secondary to IV zosyn use. Liver unremarkable on CT. Holding statin. Will continue to monitor   Depression: severity unknown. Continue on home dose of citalopram   Hx of CLL: not currently receiving treatment   Discharge Instructions  Discharge Instructions     Diet general   Complete by: As directed    Discharge instructions   Complete by: As directed    F/u w/ PCP within 1 week. PCP will need f/u on tumor markers ordered at the hospital as well abnormal CT findings. CT abd/pelvis showed: Abnormal 8 cm segment of the distal large bowel at the junction of the descending and sigmoid colon. Favor  Acute Diverticulitis/Colitis but recommend ensuring colon cancer screening is up-to-date. CT abd/plevis also showed large 8.1 cm cystic mass in the central pelvis, inseparable from the vaginal cuff and adnexa. Recommend follow-up pelvic ultrasound   Increase activity slowly   Complete by: As directed        Allergies as of 04/15/2023       Reactions   Sulfa Antibiotics Hives        Medication List     PAUSE taking these medications    atorvastatin 40 MG tablet Wait to take this until: April 23, 2023 Hold this medication until your PCP can confirm that your LFTs are back within normal limits  Commonly known as: LIPITOR Take 1 tablet (40 mg total) by mouth daily at 6 PM.       STOP taking these medications    lisinopril 5 MG tablet Commonly known as: Zestril   meclizine 25 MG tablet Commonly known as: ANTIVERT       TAKE these medications    Calcium 600+D 600-400 MG-UNIT tablet Generic drug: Calcium Carbonate-Vitamin D Take 1 tablet by mouth daily.   ciprofloxacin 500 MG tablet Commonly known as: Cipro Take 1 tablet (500 mg total) by mouth 2 (two) times daily for 7 days.   citalopram 20 MG tablet Commonly known as: CELEXA Take 20 mg by mouth daily.   clopidogrel 75 MG tablet Commonly known as: PLAVIX Take 1 tablet (75 mg total) by mouth daily.   Cranberry 450 MG Tabs Take 900 mg by mouth daily.   HYDROcodone-acetaminophen 5-325 MG tablet Commonly known as: NORCO/VICODIN Take 1 tablet by mouth every 6 (six) hours as needed for up to 3 days for moderate pain (pain score 4-6) or severe pain (pain score 7-10).   losartan 25 MG tablet Commonly known as: COZAAR Take 25 mg by mouth every evening.   methimazole 5 MG tablet Commonly known as: TAPAZOLE Take 2.5 mg by mouth daily.   metroNIDAZOLE 500 MG tablet Commonly known as: Flagyl Take 1 tablet (500 mg total) by mouth 3 (three) times daily for 7 days.   multivitamin with minerals Tabs tablet Take 1 tablet by mouth daily.   nitroGLYCERIN 0.4 MG SL tablet Commonly known as: NITROSTAT Place 0.4 mg under the tongue every 5 (five) minutes as needed for chest pain.               Durable Medical Equipment  (From admission, onward)           Start     Ordered   04/15/23 1151  For home use  only DME oxygen  Once       Question Answer Comment  Length of Need 6 Months   Mode or (Route) Nasal cannula   Liters per Minute 3   Frequency Continuous (stationary and portable oxygen unit needed)   Oxygen conserving device Yes   Oxygen delivery system Gas      04/15/23 1150   04/15/23 1117  For home use only DME Bedside commode  Once       Question:  Patient needs a bedside commode to treat with the following condition  Answer:  Generalized weakness   04/15/23 1116            Allergies  Allergen Reactions   Sulfa Antibiotics Hives    Consultations:    Procedures/Studies: DG Chest Port 1 View Result Date: 04/12/2023 CLINICAL DATA:  Hypoxia. EXAM: PORTABLE CHEST 1 VIEW COMPARISON:  04/22/2020 FINDINGS: The cardio  pericardial silhouette is enlarged. Vascular congestion with underlying chronic interstitial coarsening. No focal consolidation or pulmonary edema. No pleural effusion. Bones are diffusely demineralized. Telemetry leads overlie the chest. IMPRESSION: Enlargement of the cardiopericardial silhouette with vascular congestion. Electronically Signed   By: Kennith Center M.D.   On: 04/12/2023 06:18   CT ABDOMEN PELVIS W CONTRAST Result Date: 04/12/2023 CLINICAL DATA:  89 year old female with left lower quadrant abdominal pain for 3 days. EXAM: CT ABDOMEN AND PELVIS WITH CONTRAST TECHNIQUE: Multidetector CT imaging of the abdomen and pelvis was performed using the standard protocol following bolus administration of intravenous contrast. RADIATION DOSE REDUCTION: This exam was performed according to the departmental dose-optimization program which includes automated exposure control, adjustment of the mA and/or kV according to patient size and/or use of iterative reconstruction technique. CONTRAST:  OMNIPAQUE IOHEXOL 300 MG/ML  SOLN COMPARISON:  None Available. FINDINGS: Lower chest: Moderate to large gastric hiatal hernia, mostly intrathoracic stomach is demonstrated.  Superimposed mild cardiomegaly. No pericardial effusion. Tortuous descending thoracic aorta. Confluent and enhancing atelectasis in the medial basal segment of the left lower lobe. Trace superimposed left pleural effusion. Mild right lung base atelectasis. Hepatobiliary: Cholecystectomy.  Negative liver. Pancreas: Partially atrophied. Spleen: Negative. Adrenals/Urinary Tract: Normal adrenal glands. Normal kidneys. Symmetric renal enhancement and contrast excretion. Decompressed ureters. Decompressed urinary bladder. Stomach/Bowel: Diverticulosis in the distal descending colon and throughout much of the sigmoid. Roughly 8 cm segment of abnormal distal large bowel wall thickening, inflammation, indistinct appearance in the left lower quadrant (coronal image 27) with underlying diverticula. Mesenteric stranding and spiculation. The inflammation abates by the level of the bladder dome. No extraluminal gas or fluid is identified. The upstream descending colon is nondilated. Negative transverse colon, negative right colon. Diminutive or absent appendix is not identified. Nondilated small bowel. Mild secondary inflammation of left lower abdominal small bowel loops adjacent to the abnormal colon. Largely intrathoracic stomach. Gastric antrum remains within the abdomen. Decompressed duodenum. No free air or free fluid. Vascular/Lymphatic: Extensive Aortoiliac calcified atherosclerosis. Major arterial structures in the abdomen and pelvis remain patent. Portal venous system is patent. No lymphadenopathy. Reproductive: Surgically absent uterus. But large 8.1 cm cystic mass in the central pelvis inseparable from the vaginal cough, simple fluid density and mildly thickened appearance of adjacent adnexal tissue (series 2, image 71). No inflammation associated. Other: No pelvis free fluid. Musculoskeletal: Grade 1-grade 2 spondylolisthesis in the lower lumbar spine with advanced facet degeneration. No acute or suspicious osseous  lesion identified. IMPRESSION: 1. Abnormal 8 cm segment of the distal large bowel at the junction of the descending and sigmoid colon. Favor Acute Diverticulitis/Colitis but recommend ensuring colon cancer screening is up-to-date as adenocarcinoma can sometimes have a similar appearance and presentation. No abscess or complicating features. 2. Positive also for a Large 8.1 cm cystic mass in the central pelvis, inseparable from the vaginal cuff and adnexa. Recommend follow-up pelvic ultrasound in 6-12 months. Reference: JACR 2020 Feb;17(2):248-254. 3. Large gastric hiatal hernia, mostly intrathoracic stomach. Lung base atelectasis. Trace pleural effusion. 4.  Aortic Atherosclerosis (ICD10-I70.0).  Cardiomegaly. Electronically Signed   By: Odessa Fleming M.D.   On: 04/12/2023 04:46   (Echo, Carotid, EGD, Colonoscopy, ERCP)    Subjective: Pt c/o fatigue    Discharge Exam: Vitals:   04/15/23 0347 04/15/23 0819  BP: 117/63 134/68  Pulse: 72 (!) 56  Resp: 18 15  Temp: 98.3 F (36.8 C) 98 F (36.7 C)  SpO2: 96% 93%   Vitals:   04/14/23 1638  04/14/23 2000 04/15/23 0347 04/15/23 0819  BP: (!) 135/58 137/68 117/63 134/68  Pulse: (!) 54 (!) 50 72 (!) 56  Resp: 19 18 18 15   Temp: 97.7 F (36.5 C) (!) 97.5 F (36.4 C) 98.3 F (36.8 C) 98 F (36.7 C)  TempSrc: Oral  Oral Oral  SpO2: 95% 98% 96% 93%  Weight:      Height:        General: Pt is alert, awake, not in acute distress Cardiovascular:  S1/S2 +, no rubs, no gallops Respiratory: decreased breath sounds b/l otherwise clear  Abdominal: Soft, NT, ND, bowel sounds + Extremities: no edema, no cyanosis    The results of significant diagnostics from this hospitalization (including imaging, microbiology, ancillary and laboratory) are listed below for reference.     Microbiology: No results found for this or any previous visit (from the past 240 hours).   Labs: BNP (last 3 results) No results for input(s): "BNP" in the last 8760  hours. Basic Metabolic Panel: Recent Labs  Lab 04/12/23 0315 04/12/23 0509 04/13/23 0652 04/14/23 0501 04/15/23 0532  NA 136  --  137 137 139  K 3.2*  --  4.2 3.5 3.2*  CL 105  --  109 105 103  CO2 21*  --  22 24 29   GLUCOSE 115*  --  88 112* 93  BUN 21  --  22 21 14   CREATININE 0.78  --  0.96 0.84 0.79  CALCIUM 8.1*  --  8.2* 8.3* 8.4*  MG  --  1.9  --   --   --    Liver Function Tests: Recent Labs  Lab 04/12/23 0315 04/14/23 0501 04/15/23 0532  AST 61* 103* 65*  ALT 36 94* 71*  ALKPHOS 52 117 117  BILITOT 2.4* 1.4* 1.2  PROT 5.1* 5.2* 5.5*  ALBUMIN 3.0* 2.7* 2.7*   Recent Labs  Lab 04/12/23 0315  LIPASE 28   No results for input(s): "AMMONIA" in the last 168 hours. CBC: Recent Labs  Lab 04/12/23 0315 04/13/23 0652 04/14/23 0501 04/15/23 0532  WBC 10.2 9.4 7.6 7.3  NEUTROABS 6.9  --   --   --   HGB 11.4* 9.8* 10.2* 10.8*  HCT 34.3* 30.7* 30.7* 32.2*  MCV 97.7 101.3* 97.5 95.3  PLT 177 153 159 200   Cardiac Enzymes: No results for input(s): "CKTOTAL", "CKMB", "CKMBINDEX", "TROPONINI" in the last 168 hours. BNP: Invalid input(s): "POCBNP" CBG: No results for input(s): "GLUCAP" in the last 168 hours. D-Dimer No results for input(s): "DDIMER" in the last 72 hours. Hgb A1c No results for input(s): "HGBA1C" in the last 72 hours. Lipid Profile No results for input(s): "CHOL", "HDL", "LDLCALC", "TRIG", "CHOLHDL", "LDLDIRECT" in the last 72 hours. Thyroid function studies No results for input(s): "TSH", "T4TOTAL", "T3FREE", "THYROIDAB" in the last 72 hours.  Invalid input(s): "FREET3" Anemia work up No results for input(s): "VITAMINB12", "FOLATE", "FERRITIN", "TIBC", "IRON", "RETICCTPCT" in the last 72 hours. Urinalysis    Component Value Date/Time   COLORURINE AMBER (A) 04/12/2023 0309   APPEARANCEUR HAZY (A) 04/12/2023 0309   LABSPEC 1.024 04/12/2023 0309   PHURINE 5.0 04/12/2023 0309   GLUCOSEU NEGATIVE 04/12/2023 0309   HGBUR NEGATIVE  04/12/2023 0309   BILIRUBINUR NEGATIVE 04/12/2023 0309   KETONESUR NEGATIVE 04/12/2023 0309   PROTEINUR NEGATIVE 04/12/2023 0309   NITRITE NEGATIVE 04/12/2023 0309   LEUKOCYTESUR NEGATIVE 04/12/2023 0309   Sepsis Labs Recent Labs  Lab 04/12/23 0315 04/13/23 0865 04/14/23 0501 04/15/23 0532  WBC 10.2 9.4 7.6 7.3   Microbiology No results found for this or any previous visit (from the past 240 hours).   Time coordinating discharge: Over 30 minutes  SIGNED:   Charise Killian, MD  [facility] 04/15/2023, 12:13 PM Pager   If 7PM-7AM, please contact night-coverage [email or web address]

## 2023-04-15 NOTE — TOC Transition Note (Signed)
 Transition of Care [facility]) - Discharge Note   Patient Details  Name: Haley Rollins MRN: [id number] Date of Birth: [birth year]  Transition of Care [facility]) CM/SW Contact:  Chapman Fitch, RN Phone Number: 04/15/2023, 12:30 PM   Clinical Narrative:     Received notification from daughter Diane that they would like to use [facility] home [facility].  Referral made to [place] with [facility]  O2 referral made to [place] with [employer].  Received notification from Diane that patient no longer needs the [facility]  Daughter [name] at bedside and to transport     Barriers to Discharge: Continued Medical Work up   Patient Goals and CMS Choice Patient states their goals for this hospitalization and ongoing recovery are:: home with home health CMS Medicare.gov Compare Post Acute Care list provided to:: Patient Represenative (must comment) Choice offered to / list presented to : Adult Children      Discharge Placement                       Discharge Plan and Services Additional resources added to the After Visit Summary for                                       Social Drivers of Health (SDOH) Interventions SDOH Screenings   Food Insecurity: No Food Insecurity (04/12/2023)  Housing: Low Risk  (04/12/2023)  Transportation Needs: No Transportation Needs (04/12/2023)  Utilities: Not At Risk (04/12/2023)  Financial Resource Strain: Low Risk  (10/08/2022)   Received from [facility]  Social Connections: Moderately Integrated (04/12/2023)  Tobacco Use: Medium Risk (04/12/2023)     Readmission Risk Interventions    04/13/2023    4:53 PM  Readmission Risk Prevention Plan  Transportation Screening Complete  PCP or Specialist Appt within 5-7 Days Complete  Home Care Screening Complete  Medication Review (RN CM) Complete

## 2023-04-15 NOTE — Progress Notes (Addendum)
 Patient is not able to walk the distance required to go the bathroom, or he/she is unable to safely negotiate stairs required to access the bathroom.  A 3in1 BSC will alleviate this problem

## 2023-04-15 NOTE — Progress Notes (Signed)
 Physical Therapy Treatment Patient Details Name: Haley Rollins MRN: [id number] DOB: [birth year] Today's Date: 04/15/2023   History of Present Illness Pt is an 89 y.o. female with medical history significant of HTN, hypothyroidism, TIA on Plavix, CLL, presented with worsening of new onset abdominal pain.  MD assessment includes acute diverticulitis, hyperthyroidism, and adnexal mass.    PT Comments  Family assisted pt to bathroom  she is able to walk 50' in room  then return to bathroom and after small hard BM returns to recliner.  Awaiting discharge home.  Questions answered from family.   If plan is discharge home, recommend the following: A little help with walking and/or transfers;A little help with bathing/dressing/bathroom;Assistance with cooking/housework;Assist for transportation;Help with stairs or ramp for entrance   Can travel by private vehicle        Equipment Recommendations  None recommended by PT    Recommendations for Other Services       Precautions / Restrictions Precautions Precautions: Fall Restrictions Weight Bearing Restrictions Per Provider Order: No     Mobility  Bed Mobility               General bed mobility comments: in char before and after Patient Response: Cooperative  Transfers Overall transfer level: Needs assistance Equipment used: Rolling walker (2 wheels) Transfers: Sit to/from Stand Sit to Stand: Supervision, Contact guard assist                Ambulation/Gait Ambulation/Gait assistance: Supervision Gait Distance (Feet): 50 Feet Assistive device: Rolling walker (2 wheels) Gait Pattern/deviations: Step-through pattern, Decreased step length - right, Decreased step length - left Gait velocity: Decreased         Stairs             Wheelchair Mobility     Tilt Bed Tilt Bed Patient Response: Cooperative  Modified Rankin (Stroke Patients Only)       Balance Overall balance assessment: Mild deficits  observed, not formally tested   Sitting balance-Leahy Scale: Normal       Standing balance-Leahy Scale: Good Standing balance comment: use of RW for support                            Communication    Cognition Arousal: Alert Behavior During Therapy: WFL for tasks assessed/performed   PT - Cognitive impairments: No apparent impairments                                Cueing    Exercises      General Comments        Pertinent Vitals/Pain Pain Assessment Pain Assessment: No/denies pain    Home Living                          Prior Function            PT Goals (current goals can now be found in the care plan section) Progress towards PT goals: Progressing toward goals    Frequency    Min 1X/week      PT Plan      Co-evaluation              AM-PAC PT "6 Clicks" Mobility   Outcome Measure  Help needed turning from your back to your side while in a flat bed without using bedrails?: A Little Help  needed moving from lying on your back to sitting on the side of a flat bed without using bedrails?: A Little Help needed moving to and from a bed to a chair (including a wheelchair)?: A Little Help needed standing up from a chair using your arms (e.g., wheelchair or bedside chair)?: A Little Help needed to walk in hospital room?: A Little Help needed climbing 3-5 steps with a railing? : A Little 6 Click Score: 18    End of Session Equipment Utilized During Treatment: Gait belt Activity Tolerance: Patient tolerated treatment well Patient left: in chair;with call bell/phone within reach;with chair alarm set;with family/visitor present Nurse Communication: Mobility status;Other (comment) (Sp02 with mobility) PT Visit Diagnosis: Difficulty in walking, not elsewhere classified (R26.2);Muscle weakness (generalized) (M62.81)     Time: 3557-3220 PT Time Calculation (min) (ACUTE ONLY): 14 min  Charges:    $Gait Training: 8-22  mins PT General Charges $$ ACUTE PT VISIT: 1 Visit                   Danielle Dess, PTA 04/15/23, 12:48 PM

## 2023-04-15 NOTE — Progress Notes (Signed)
 O2 Sat on RA at rest: 89%  O2 Sat on RA with ambulation: 85%  O2 Sat with Oxygen at rest: 96% 3L  O2 Sat with Oxygen with ambulation: 93% 3L

## 2023-04-16 LAB — CA 19-9 (SERIAL): CA 19-9: 6 U/mL (ref 0–35)

## 2023-04-17 ENCOUNTER — Encounter: Payer: Self-pay | Admitting: Emergency Medicine

## 2023-04-17 ENCOUNTER — Other Ambulatory Visit: Payer: Self-pay

## 2023-04-17 ENCOUNTER — Emergency Department: Payer: Medicare HMO

## 2023-04-17 ENCOUNTER — Emergency Department
Admission: EM | Admit: 2023-04-17 | Discharge: 2023-04-17 | Disposition: A | Discharge status: Home / Self Care | Payer: Medicare HMO | Attending: Emergency Medicine | Admitting: Emergency Medicine

## 2023-04-17 DIAGNOSIS — R0789 Other chest pain: Secondary | ICD-10-CM | POA: Insufficient documentation

## 2023-04-17 DIAGNOSIS — E039 Hypothyroidism, unspecified: Secondary | ICD-10-CM | POA: Insufficient documentation

## 2023-04-17 DIAGNOSIS — Z856 Personal history of leukemia: Secondary | ICD-10-CM | POA: Diagnosis not present

## 2023-04-17 DIAGNOSIS — I10 Essential (primary) hypertension: Secondary | ICD-10-CM | POA: Diagnosis not present

## 2023-04-17 DIAGNOSIS — R079 Chest pain, unspecified: Secondary | ICD-10-CM

## 2023-04-17 DIAGNOSIS — R0602 Shortness of breath: Secondary | ICD-10-CM | POA: Diagnosis present

## 2023-04-17 LAB — BASIC METABOLIC PANEL
Anion gap: 10 (ref 5–15)
BUN: 15 mg/dL (ref 8–23)
CO2: 28 mmol/L (ref 22–32)
Calcium: 9.3 mg/dL (ref 8.9–10.3)
Chloride: 97 mmol/L — ABNORMAL LOW (ref 98–111)
Creatinine, Ser: 0.77 mg/dL (ref 0.44–1.00)
GFR, Estimated: >60 mL/min (ref >60)
Glucose, Bld: 106 mg/dL — ABNORMAL HIGH (ref 70–99)
Potassium: 4.1 mmol/L (ref 3.5–5.1)
Sodium: 135 mmol/L (ref 135–145)

## 2023-04-17 LAB — CBC
HCT: 38.4 % (ref 36.0–46.0)
Hemoglobin: 12.8 g/dL (ref 12.0–15.0)
MCH: 32.7 pg (ref 26.0–34.0)
MCHC: 33.3 g/dL (ref 30.0–36.0)
MCV: 98 fL (ref 80.0–100.0)
Platelets: 255 10*3/uL (ref 150–400)
RBC: 3.92 MIL/uL (ref 3.87–5.11)
RDW: 14 % (ref 11.5–15.5)
WBC: 11.1 10*3/uL — ABNORMAL HIGH (ref 4.0–10.5)
nRBC: 0 % (ref 0.0–0.2)

## 2023-04-17 LAB — TROPONIN I (HIGH SENSITIVITY)
Troponin I (High Sensitivity): 18 ng/L — ABNORMAL HIGH (ref <18)
Troponin I (High Sensitivity): 17 ng/L (ref <18)

## 2023-04-17 LAB — PROTIME-INR
INR: 1 (ref 0.8–1.2)
Prothrombin Time: 13.5 s (ref 11.4–15.2)

## 2023-04-17 MED ORDER — ASPIRIN 325 MG PO TABS
325.0000 mg | ORAL_TABLET | Freq: Once | ORAL | Status: AC
  Administered 2023-04-17: 325 mg via ORAL
  Filled 2023-04-17: qty 1

## 2023-04-17 MED ORDER — IOHEXOL 350 MG/ML SOLN
75.0000 mL | Freq: Once | INTRAVENOUS | Status: AC | PRN
  Administered 2023-04-17: 75 mL via INTRAVENOUS

## 2023-04-17 NOTE — ED Provider Notes (Signed)
-----------------------------------------   6:46 PM on 04/17/2023 ----------------------------------------- Patient care assumed from Dr. Rosalia Hammers.  Patient's ultrasound is negative for DVT.  Patient's CT scan of the chest is negative for PE does show some small pleural effusion as well as multifocal inflammation or air trapping.  Given the patient's new oxygen requirement we will have the patient follow-up with pulmonology.  Given the patient's reassuring workup however I believe the patient safe for discharge home.  Patient and daughter agreeable to plan of care.   Minna Antis, MD 04/17/23 [phone]

## 2023-04-17 NOTE — ED Provider Notes (Signed)
 [facility] Provider Note    Event Date/Time   First MD Initiated Contact with Patient 04/17/23 1335     (approximate)   History   Chest Pain   HPI  Haley Rollins is a 89 year old female with history of HTN, hypothyroidism, CLL presenting to the emergency department for evaluation of chest pain and shortness of breath.  Patient recently admitted for diverticulitis.  Had a follow-up appointment with her primary care doctor.  There, she reported that she had some recent chest pain and shortness of breath.  Chest pain began this morning, currently resolved.  Was seen by EMS, but had schedule appoint with primary care doctor, so declined ER transport at that time.  When recently admitted, she was noted to have hypoxia for which she was discharged on home oxygen, but is not sure why she was placed on this.  She also notes some swelling in her left leg over the past few weeks.  Her primary care doctor was concerned about possible DVT/PE as well as possible cardiac etiology so she was directed to the ER for further evaluation.    Physical Exam   Triage Vital Signs: ED Triage Vitals  Encounter Vitals Group     BP 04/17/23 1243 (!) 147/65     Systolic BP Percentile --      Diastolic BP Percentile --      Pulse Rate 04/17/23 1243 66     Resp 04/17/23 1243 16     Temp 04/17/23 1243 98.1 F (36.7 C)     Temp Source 04/17/23 1243 Oral     SpO2 04/17/23 1243 94 %     Weight 04/17/23 1240 163 lb 2.3 oz (74 kg)     Height 04/17/23 1240 5\' 5"  (1.651 m)     Head Circumference --      Peak Flow --      Pain Score 04/17/23 1239 6     Pain Loc --      Pain Education --      Exclude from Growth Chart --     Most recent vital signs: Vitals:   04/17/23 1545 04/17/23 1600  BP: (!) 150/66   Pulse: (!) 52 (!) 53  Resp: 20 16  Temp:    SpO2: 100% 100%     General: Awake, interactive  CV:  Regular rate, good peripheral perfusion.  Resp:  Unlabored respirations,  lungs clear to auscultation, satting at 100% on 2 L which she reports she is wearing around-the-clock Abd:  Nondistended, soft, nontender to palpation Neuro:  Symmetric facial movement, fluid speech MSK:  Mild swelling of the left lower extremity compared to the contralateral side, 2+ DP pulses bilaterally   ED Results / Procedures / Treatments   Labs (all labs ordered are listed, but only abnormal results are displayed) Labs Reviewed  BASIC METABOLIC PANEL - Abnormal; Notable for the following components:      Result Value   Chloride 97 (*)    Glucose, Bld 106 (*)    All other components within normal limits  CBC - Abnormal; Notable for the following components:   WBC 11.1 (*)    All other components within normal limits  TROPONIN I (HIGH SENSITIVITY) - Abnormal; Notable for the following components:   Troponin I (High Sensitivity) 18 (*)    All other components within normal limits  PROTIME-INR  TROPONIN I (HIGH SENSITIVITY)     EKG EKG independently reviewed interpreted by myself (ER attending)  demonstrates:  EKG demonstrates normal sinus rhythm at a rate of 66, PR 180, QRS 144, QTc 490, left bundle branch block noted, no superimposed appreciable ischemia, LBBB previously noted  RADIOLOGY Imaging independently reviewed and interpreted by myself demonstrates:  CXR without focal consolidation CTA of the chest and ultrasound of the lower extremity pending  PROCEDURES:  Critical Care performed: No  Procedures   MEDICATIONS ORDERED IN ED: Medications  aspirin tablet 325 mg (has no administration in time range)  iohexol (OMNIPAQUE) 350 MG/ML injection 75 mL (75 mLs Intravenous Contrast Given 04/17/23 1444)     IMPRESSION / MDM / ASSESSMENT AND PLAN / ED COURSE  I reviewed the triage vital signs and the nursing notes.  Differential diagnosis includes, but is not limited to, DVT, PE, ACS, arrhythmia, anemia  Patient's presentation is most consistent with acute  presentation with potential threat to life or bodily function.  89 year old female presenting to the ER for evaluation of chest pain and shortness of breath.  Sinus bradycardia on presentation, stable blood pressure.  Labs overall reassuring including negative troponin x 2.  EKG without acute ischemic findings, known left bundle noted.  Will obtain ultrasound of the left lower extremity and CT of the chest to further evaluate.  If these are reassuring, patient may be stable for discharge with outpatient follow-up.  Signed out to oncoming physician at 1530 pending imaging and disposition.      FINAL CLINICAL IMPRESSION(S) / ED DIAGNOSES   Final diagnoses:  Shortness of breath  Nonspecific chest pain     Rx / DC Orders   ED Discharge Orders     None        Note:  This document was prepared using Dragon voice recognition software and may include unintentional dictation errors.   Trinna Post, MD 04/17/23 [phone]

## 2023-04-17 NOTE — Discharge Instructions (Signed)
 Please call the number provided for pulmonology to arrange a follow-up appointment for further workup.  Return to the emergency department for any worsening shortness of breath any chest pain or any other symptom personally concerning to yourself.

## 2023-04-17 NOTE — ED Triage Notes (Signed)
 First Nurse Note;  Pt via POV from [facility]. Dr. Graciela Husbands was concerned about possible PE. Reports L sided CP and SOB. Pt was recently hospitalized and needed home O2. Reports that pt did have leg discomfort that went away.

## 2023-04-17 NOTE — ED Notes (Signed)
 Pt transported to CT ?

## 2023-04-17 NOTE — ED Triage Notes (Signed)
 Patient to ED via POV from Dr Graciela Husbands office for CP and SOB. Concerned for PE. Wearing 2L O2- since being dc'd on Monday.

## 2023-06-10 ENCOUNTER — Ambulatory Visit: Payer: Federal, State, Local not specified - PPO | Admitting: Dermatology

## 2023-06-10 ENCOUNTER — Encounter: Payer: Self-pay | Admitting: Urgent Care

## 2023-07-03 ENCOUNTER — Ambulatory Visit: Admit: 2023-07-03 | Admitting: Gastroenterology

## 2023-07-03 SURGERY — COLONOSCOPY
Anesthesia: General
